# Patient Record
Sex: Female | Born: 1985 | Race: White | Hispanic: No | Marital: Married | Smoking: Never smoker
Health system: Southern US, Community
[De-identification: ages and names within clinical notes are randomized; demographics above are authoritative.]

## PROBLEM LIST (undated history)

## (undated) DIAGNOSIS — Z789 Other specified health status: Secondary | ICD-10-CM

## (undated) DIAGNOSIS — J45909 Unspecified asthma, uncomplicated: Secondary | ICD-10-CM

## (undated) HISTORY — PX: TONSILLECTOMY: SUR1361

## (undated) HISTORY — DX: Unspecified asthma, uncomplicated: J45.909

---

## 2006-06-18 ENCOUNTER — Other Ambulatory Visit: Admission: RE | Admit: 2006-06-18 | Discharge: 2006-06-18 | Payer: Self-pay | Admitting: Family Medicine

## 2007-06-20 ENCOUNTER — Other Ambulatory Visit: Admission: RE | Admit: 2007-06-20 | Discharge: 2007-06-20 | Payer: Self-pay | Admitting: Family Medicine

## 2007-12-02 ENCOUNTER — Emergency Department (HOSPITAL_COMMUNITY): Admission: EM | Admit: 2007-12-02 | Discharge: 2007-12-02 | Payer: Self-pay | Admitting: Emergency Medicine

## 2008-06-21 ENCOUNTER — Other Ambulatory Visit: Admission: RE | Admit: 2008-06-21 | Discharge: 2008-06-21 | Payer: Self-pay | Admitting: Family Medicine

## 2009-06-01 DIAGNOSIS — N949 Unspecified condition associated with female genital organs and menstrual cycle: Secondary | ICD-10-CM

## 2009-06-01 DIAGNOSIS — N938 Other specified abnormal uterine and vaginal bleeding: Secondary | ICD-10-CM | POA: Insufficient documentation

## 2009-06-01 DIAGNOSIS — R0602 Shortness of breath: Secondary | ICD-10-CM | POA: Insufficient documentation

## 2009-06-02 ENCOUNTER — Ambulatory Visit: Payer: Self-pay | Admitting: Pulmonary Disease

## 2009-06-02 ENCOUNTER — Encounter: Payer: Self-pay | Admitting: Pulmonary Disease

## 2009-07-01 ENCOUNTER — Other Ambulatory Visit: Admission: RE | Admit: 2009-07-01 | Discharge: 2009-07-01 | Payer: Self-pay | Admitting: Family Medicine

## 2010-06-12 ENCOUNTER — Other Ambulatory Visit: Admission: RE | Admit: 2010-06-12 | Discharge: 2010-06-12 | Payer: Self-pay | Admitting: Family Medicine

## 2010-11-21 NOTE — Letter (Signed)
Summary: Letter Concerning [facility]  Letter Concerning [facility]   Imported By: Lennie Odor 11/02/2009 12:27:15  _____________________________________________________________________  External Attachment:    Type:   Image     Comment:   External Document

## 2011-06-18 ENCOUNTER — Other Ambulatory Visit (HOSPITAL_COMMUNITY)
Admission: RE | Admit: 2011-06-18 | Discharge: 2011-06-18 | Disposition: A | Discharge status: Home / Self Care | Payer: Managed Care, Other (non HMO) | Source: Ambulatory Visit | Attending: Family Medicine | Admitting: Family Medicine

## 2011-06-18 DIAGNOSIS — Z01419 Encounter for gynecological examination (general) (routine) without abnormal findings: Secondary | ICD-10-CM | POA: Insufficient documentation

## 2011-07-13 LAB — COMPREHENSIVE METABOLIC PANEL
AST: 31
Albumin: 4.2
Chloride: 108
Creatinine, Ser: 0.84
GFR calc Af Amer: >60
Potassium: 4.8
Total Bilirubin: 1.6 — ABNORMAL HIGH
Total Protein: 7

## 2011-07-13 LAB — DIFFERENTIAL
Basophils Absolute: 0
Basophils Relative: 1
Eosinophils Absolute: 0
Eosinophils Relative: 0
Lymphocytes Relative: 20
Lymphs Abs: 1.3
Monocytes Absolute: 0.3
Monocytes Relative: 5
Neutro Abs: 4.7
Neutrophils Relative %: 74

## 2011-07-13 LAB — ACETAMINOPHEN LEVEL: Acetaminophen (Tylenol), Serum: <10 — ABNORMAL LOW

## 2011-07-13 LAB — ETHANOL: Alcohol, Ethyl (B): <5

## 2011-07-13 LAB — CBC
HCT: 41.2
Hemoglobin: 14.3
MCHC: 34.8
MCV: 87.7
Platelets: 224
RBC: 4.7
RDW: 12.5
WBC: 6.3

## 2011-07-13 LAB — COMPREHENSIVE METABOLIC PANEL WITH GFR
ALT: 28
Alkaline Phosphatase: 48
BUN: 5 — ABNORMAL LOW
CO2: 23
Calcium: 9.5
GFR calc non Af Amer: >60
Glucose, Bld: 80
Sodium: 140

## 2011-07-13 LAB — URINE MICROSCOPIC-ADD ON

## 2011-07-13 LAB — TRICYCLICS SCREEN, URINE: TCA Scrn: NOT DETECTED

## 2011-07-13 LAB — RAPID URINE DRUG SCREEN, HOSP PERFORMED: Tetrahydrocannabinol: NOT DETECTED

## 2011-07-13 LAB — URINALYSIS, ROUTINE W REFLEX MICROSCOPIC
Bilirubin Urine: NEGATIVE
Ketones, ur: 40 — AB
Specific Gravity, Urine: 1.018
pH: 8

## 2011-07-13 LAB — PROTIME-INR
INR: 1.1
Prothrombin Time: 14.2

## 2011-07-13 LAB — PREGNANCY, URINE: Preg Test, Ur: NEGATIVE

## 2011-07-13 LAB — URINE CULTURE
Colony Count: NO GROWTH
Culture: NO GROWTH

## 2011-07-13 LAB — SALICYLATE LEVEL: Salicylate Lvl: <4

## 2014-04-05 ENCOUNTER — Emergency Department (HOSPITAL_COMMUNITY): Payer: Managed Care, Other (non HMO)

## 2014-04-05 ENCOUNTER — Emergency Department (HOSPITAL_COMMUNITY)
Admission: EM | Admit: 2014-04-05 | Discharge: 2014-04-05 | Disposition: A | Discharge status: Home / Self Care | Payer: Managed Care, Other (non HMO) | Attending: Emergency Medicine | Admitting: Emergency Medicine

## 2014-04-05 ENCOUNTER — Encounter (HOSPITAL_COMMUNITY): Payer: Self-pay | Admitting: Emergency Medicine

## 2014-04-05 DIAGNOSIS — Z3202 Encounter for pregnancy test, result negative: Secondary | ICD-10-CM | POA: Insufficient documentation

## 2014-04-05 DIAGNOSIS — R1011 Right upper quadrant pain: Secondary | ICD-10-CM | POA: Insufficient documentation

## 2014-04-05 DIAGNOSIS — Z880 Allergy status to penicillin: Secondary | ICD-10-CM | POA: Insufficient documentation

## 2014-04-05 DIAGNOSIS — N201 Calculus of ureter: Secondary | ICD-10-CM | POA: Insufficient documentation

## 2014-04-05 LAB — CBC WITH DIFFERENTIAL/PLATELET
BASOS ABS: 0 10*3/uL (ref 0.0–0.1)
BASOS PCT: 0 % (ref 0–1)
EOS PCT: 2 % (ref 0–5)
Eosinophils Absolute: 0.2 10*3/uL (ref 0.0–0.7)
HEMATOCRIT: 41.3 % (ref 36.0–46.0)
Hemoglobin: 13.5 g/dL (ref 12.0–15.0)
Lymphocytes Relative: 42 % (ref 12–46)
Lymphs Abs: 3.3 10*3/uL (ref 0.7–4.0)
MCH: 29.4 pg (ref 26.0–34.0)
MCHC: 32.7 g/dL (ref 30.0–36.0)
MCV: 90 fL (ref 78.0–100.0)
Monocytes Absolute: 0.6 10*3/uL (ref 0.1–1.0)
Monocytes Relative: 7 % (ref 3–12)
NEUTROS ABS: 3.8 10*3/uL (ref 1.7–7.7)
Neutrophils Relative %: 49 % (ref 43–77)
Platelets: 282 10*3/uL (ref 150–400)
RBC: 4.59 MIL/uL (ref 3.87–5.11)
RDW: 12.4 % (ref 11.5–15.5)
WBC: 7.9 10*3/uL (ref 4.0–10.5)

## 2014-04-05 LAB — URINALYSIS, ROUTINE W REFLEX MICROSCOPIC
Bilirubin Urine: NEGATIVE
GLUCOSE, UA: NEGATIVE mg/dL
Hgb urine dipstick: NEGATIVE
KETONES UR: NEGATIVE mg/dL
LEUKOCYTES UA: NEGATIVE
Nitrite: NEGATIVE
Protein, ur: NEGATIVE mg/dL
Specific Gravity, Urine: 1.019 (ref 1.005–1.030)
Urobilinogen, UA: 1 mg/dL (ref 0.0–1.0)
pH: 8.5 — ABNORMAL HIGH (ref 5.0–8.0)

## 2014-04-05 LAB — COMPREHENSIVE METABOLIC PANEL
ALK PHOS: 95 U/L (ref 39–117)
ALT: 18 U/L (ref 0–35)
AST: 16 U/L (ref 0–37)
Albumin: 4.2 g/dL (ref 3.5–5.2)
BUN: 9 mg/dL (ref 6–23)
CALCIUM: 9.8 mg/dL (ref 8.4–10.5)
CO2: 23 meq/L (ref 19–32)
Chloride: 103 mEq/L (ref 96–112)
Creatinine, Ser: 0.75 mg/dL (ref 0.50–1.10)
GFR calc non Af Amer: >90 mL/min (ref >90)
GLUCOSE: 95 mg/dL (ref 70–99)
POTASSIUM: 4.6 meq/L (ref 3.7–5.3)
SODIUM: 141 meq/L (ref 137–147)
TOTAL PROTEIN: 7.7 g/dL (ref 6.0–8.3)
Total Bilirubin: 0.3 mg/dL (ref 0.3–1.2)

## 2014-04-05 LAB — POC URINE PREG, ED: Preg Test, Ur: NEGATIVE

## 2014-04-05 LAB — LIPASE, BLOOD: Lipase: 45 U/L (ref 11–59)

## 2014-04-05 MED ORDER — ONDANSETRON HCL 4 MG/2ML IJ SOLN
4.0000 mg | Freq: Once | INTRAMUSCULAR | Status: AC
  Administered 2014-04-05: 4 mg via INTRAVENOUS
  Filled 2014-04-05: qty 2

## 2014-04-05 MED ORDER — OXYCODONE-ACETAMINOPHEN 5-325 MG PO TABS
1.0000 | ORAL_TABLET | Freq: Once | ORAL | Status: AC
  Administered 2014-04-05: 1 via ORAL
  Filled 2014-04-05: qty 1

## 2014-04-05 MED ORDER — ONDANSETRON 4 MG PO TBDP
8.0000 mg | ORAL_TABLET | Freq: Once | ORAL | Status: AC
  Administered 2014-04-05: 8 mg via ORAL
  Filled 2014-04-05: qty 2

## 2014-04-05 MED ORDER — TAMSULOSIN HCL 0.4 MG PO CAPS: 0.4000 mg | ORAL_CAPSULE | Freq: Every day | ORAL | Status: DC

## 2014-04-05 MED ORDER — KETOROLAC TROMETHAMINE 30 MG/ML IJ SOLN
30.0000 mg | Freq: Once | INTRAMUSCULAR | Status: AC
  Administered 2014-04-05: 30 mg via INTRAVENOUS
  Filled 2014-04-05: qty 1

## 2014-04-05 MED ORDER — ONDANSETRON HCL 4 MG PO TABS: 4.0000 mg | ORAL_TABLET | Freq: Three times a day (TID) | ORAL | Status: DC | PRN

## 2014-04-05 MED ORDER — OXYCODONE-ACETAMINOPHEN 5-325 MG PO TABS: 1.0000 | ORAL_TABLET | ORAL | Status: DC | PRN

## 2014-04-05 MED ORDER — HYDROMORPHONE HCL PF 1 MG/ML IJ SOLN
1.0000 mg | INTRAMUSCULAR | Status: AC
  Administered 2014-04-05: 1 mg via INTRAVENOUS
  Filled 2014-04-05: qty 1

## 2014-04-05 NOTE — ED Notes (Signed)
Pt states that she ate some pimiento cheese at approx 1130. States that after that she began having cramps with nausea and vomiting. States that she has rt flank pain and "swelling".

## 2014-04-05 NOTE — ED Provider Notes (Signed)
CSN: [id number]     Arrival date & time 04/05/14  1602 History   First MD Initiated Contact with Patient 04/05/14 1818     Chief Complaint  Patient presents with  . Nausea  . Emesis  . Flank Pain     (Consider location/radiation/quality/duration/timing/severity/associated sxs/prior Treatment) HPI  Pt p/w acute onset right flank and right abdominal pain with associated N/V that began several hours ago and has gradually worsened.  Pain is sharp and crampy, constant.  Exacerbated by lying on her back. She initially thought she had food poisoning.  Denies diarrhea or change in bowels.  Denies urinary symptoms, abnormal vaginal discharge or bleeding.  LMP was two weeks ago, was on time and normal.  Denies possibility of pregnancy.  Denies hx kidney stones.  Denies hx of ovarian cysts.   Both parents have hx kidney stones.    History reviewed. No pertinent past medical history. Past Surgical History  Procedure Laterality Date  . Tonsillectomy     No family history on file. History  Substance Use Topics  . Smoking status: Never Smoker   . Smokeless tobacco: Not on file  . Alcohol Use: Not on file   OB History   Grav Para Term Preterm Abortions TAB SAB Ect Mult Living                 Review of Systems  All other systems reviewed and are negative.     Allergies  Penicillins  Home Medications   Prior to Admission medications   Medication Sig Start Date End Date Taking? Authorizing Provider  PRESCRIPTION MEDICATION Birth control 28 day   Yes Historical Provider, MD   BP 143/70  Pulse 75  Temp(Src) 98.1 F (36.7 C)  Resp 18  SpO2 100% Physical Exam  Nursing note and vitals reviewed. Constitutional: She appears well-developed and well-nourished. No distress.  HENT:  Head: Normocephalic and atraumatic.  Neck: Neck supple.  Cardiovascular: Normal rate and regular rhythm.   Pulmonary/Chest: Effort normal and breath sounds normal. No respiratory distress. She has no  wheezes. She has no rales.  Abdominal: Soft. She exhibits no distension. There is tenderness in the right upper quadrant and right lower quadrant. There is CVA tenderness (right). There is no rebound and no guarding.  Neurological: She is alert.  Skin: She is not diaphoretic.    ED Course  Procedures (including critical care time) Labs Review Labs Reviewed  URINALYSIS, ROUTINE W REFLEX MICROSCOPIC - Abnormal; Notable for the following:    APPearance CLOUDY (*)    pH 8.5 (*)    All other components within normal limits  URINE CULTURE  COMPREHENSIVE METABOLIC PANEL  CBC WITH DIFFERENTIAL  LIPASE, BLOOD  POC URINE PREG, ED    Imaging Review Ct Abdomen Pelvis Wo Contrast  04/05/2014   CLINICAL DATA:  Right flank pain  EXAM: CT ABDOMEN AND PELVIS WITHOUT CONTRAST  TECHNIQUE: Multidetector CT imaging of the abdomen and pelvis was performed following the standard protocol without IV contrast.  COMPARISON:  None.  FINDINGS: The lung bases are clear.  There is a 1 mm right UVJ calculus resulting in mild right hydroureteronephrosis. There is no other renal, ureteral or bladder calculus. No perinephric stranding is seen. The kidneys are symmetric in size without evidence for exophytic mass. The bladder is unremarkable.  The liver demonstrates no focal abnormality. The gallbladder is unremarkable. The spleen demonstrates no focal abnormality. The adrenal glands and pancreas are normal.  The unopacified stomach, duodenum, small  intestine and large intestine are unremarkable, but evaluation is limited by lack of oral contrast. There is no pneumoperitoneum, pneumatosis, or portal venous gas. There is no abdominal or pelvic free fluid. There is no lymphadenopathy.  The abdominal aorta is normal in caliber.  The osseous structures are unremarkable.  IMPRESSION: 1. There is a 1 mm right UVJ calculus resulting in mild right hydroureteronephrosis.   Electronically Signed   By: Elige KoHetal  Patel   On: 04/05/2014 19:07      EKG Interpretation None      6:28 PM Pt has arrived to the room, IV being established.  I have seen and examined the patient.  DDX includes ureteral stone, ovarian torsion.  Labs, UA unremarkable.  Pregnancy test is negative.  CT abd/pelvis without contrast, pelvic cart, pain and nausea medication ordered.   Father and mother have seen Dr Isabel CapriceGrapey in the past, would like to follow up with him.   MDM   Final diagnoses:  Right ureteral stone    Pt with acute onset right flank pain with N/V that began a few hours prior to arrival.  +CVA tenderness.  Found to have 1 mm right ureteral stone at UVJ with mild hydroureteronephrosis.  Labs otherwise unremarkable.  UA does not show infection.  Urine sent for culture.  Pain controlled in ED.  D/C home with percocet, zofran, flomax, urology follow up.  Discussed result, findings, treatment, and follow up  with patient.  Pt given return precautions.  Pt verbalizes understanding and agrees with plan.        Trixie Dredgemily Lizanne Erker, PA-C 04/05/14 1941

## 2014-04-05 NOTE — ED Provider Notes (Signed)
Medical screening examination/treatment/procedure(s) were performed by non-physician practitioner and as supervising physician I was immediately available for consultation/collaboration.    Nelia Shiobert L Mustafa Potts, MD 04/05/14 [id number]

## 2014-04-05 NOTE — Discharge Instructions (Signed)
Read the information below.  Use the prescribed medication as directed.  Please discuss all new medications with your pharmacist.  Do not take additional tylenol while taking the prescribed pain medication to avoid overdose.  You may return to the Emergency Department at any time for worsening condition or any new symptoms that concern you.  If you develop high fevers, worsening abdominal/flank pain, uncontrolled vomiting, or are unable to tolerate fluids by mouth or unable to urinate, return to the ER for a recheck.     Kidney Stones Kidney stones (urolithiasis) are deposits that form inside your kidneys. The intense pain is caused by the stone moving through the urinary tract. When the stone moves, the ureter goes into spasm around the stone. The stone is usually passed in the urine.  CAUSES   A disorder that makes certain neck glands produce too much parathyroid hormone (primary hyperparathyroidism).  A buildup of uric acid crystals, similar to gout in your joints.  Narrowing (stricture) of the ureter.  A kidney obstruction present at birth (congenital obstruction).  Previous surgery on the kidney or ureters.  Numerous kidney infections. SYMPTOMS   Feeling sick to your stomach (nauseous).  Throwing up (vomiting).  Blood in the urine (hematuria).  Pain that usually spreads (radiates) to the groin.  Frequency or urgency of urination. DIAGNOSIS   Taking a history and physical exam.  Blood or urine tests.  CT scan.  Occasionally, an examination of the inside of the urinary bladder (cystoscopy) is performed. TREATMENT   Observation.  Increasing your fluid intake.  Extracorporeal shock wave lithotripsy This is a noninvasive procedure that uses shock waves to break up kidney stones.  Surgery may be needed if you have severe pain or persistent obstruction. There are various surgical procedures. Most of the procedures are performed with the use of small instruments. Only small  incisions are needed to accommodate these instruments, so recovery time is minimized. The size, location, and chemical composition are all important variables that will determine the proper choice of action for you. Talk to your health care provider to better understand your situation so that you will minimize the risk of injury to yourself and your kidney.  HOME CARE INSTRUCTIONS   Drink enough water and fluids to keep your urine clear or pale yellow. This will help you to pass the stone or stone fragments.  Strain all urine through the provided strainer. Keep all particulate matter and stones for your health care provider to see. The stone causing the pain may be as small as a grain of salt. It is very important to use the strainer each and every time you pass your urine. The collection of your stone will allow your health care provider to analyze it and verify that a stone has actually passed. The stone analysis will often identify what you can do to reduce the incidence of recurrences.  Only take over-the-counter or prescription medicines for pain, discomfort, or fever as directed by your health care provider.  Make a follow-up appointment with your health care provider as directed.  Get follow-up X-rays if required. The absence of pain does not always mean that the stone has passed. It may have only stopped moving. If the urine remains completely obstructed, it can cause loss of kidney function or even complete destruction of the kidney. It is your responsibility to make sure X-rays and follow-ups are completed. Ultrasounds of the kidney can show blockages and the status of the kidney. Ultrasounds are not associated  with any radiation and can be performed easily in a matter of minutes. SEEK MEDICAL CARE IF:  You experience pain that is progressive and unresponsive to any pain medicine you have been prescribed. SEEK IMMEDIATE MEDICAL CARE IF:   Pain cannot be controlled with the prescribed  medicine.  You have a fever or shaking chills.  The severity or intensity of pain increases over 18 hours and is not relieved by pain medicine.  You develop a new onset of abdominal pain.  You feel faint or pass out.  You are unable to urinate. MAKE SURE YOU:   Understand these instructions.  Will watch your condition.  Will get help right away if you are not doing well or get worse. Document Released: 10/08/2005 Document Revised: 06/10/2013 Document Reviewed: 03/11/2013 [facility] Patient Information 2014 [place], [place].  Ureteral Colic (Kidney Stones) Ureteral colic is the result of a condition when kidney stones form inside the kidney. Once kidney stones are formed they may move into the tube that connects the kidney with the bladder (ureter). If this occurs, this condition may cause pain (colic) in the ureter.  CAUSES  Pain is caused by stone movement in the ureter and the obstruction caused by the stone. SYMPTOMS  The pain comes and goes as the ureter contracts around the stone. The pain is usually intense, sharp, and stabbing in character. The location of the pain may move as the stone moves through the ureter. When the stone is near the kidney the pain is usually located in the back and radiates to the belly (abdomen). When the stone is ready to pass into the bladder the pain is often located in the lower abdomen on the side the stone is located. At this location, the symptoms may mimic those of a urinary tract infection with urinary frequency. Once the stone is located here it often passes into the bladder and the pain disappears completely. TREATMENT   Your caregiver will provide you with medicine for pain relief.  You may require specialized follow-up X-rays.  The absence of pain does not always mean that the stone has passed. It may have just stopped moving. If the urine remains completely obstructed, it can cause loss of kidney function or even complete destruction of the  involved kidney. It is your responsibility and in your interest that X-rays and follow-ups as suggested by your caregiver are completed. Relief of pain without passage of the stone can be associated with severe damage to the kidney, including loss of kidney function on that side.  If your stone does not pass on its own, additional measures may be taken by your caregiver to ensure its removal. HOME CARE INSTRUCTIONS   Increase your fluid intake. Water is the preferred fluid since juices containing vitamin C may acidify the urine making it less likely for certain stones (uric acid stones) to pass.  Strain all urine. A strainer will be provided. Keep all particulate matter or stones for your caregiver to inspect.  Take your pain medicine as directed.  Make a follow-up appointment with your caregiver as directed.  Remember that the goal is passage of your stone. The absence of pain does not mean the stone is gone. Follow your caregiver's instructions.  Only take over-the-counter or prescription medicines for pain, discomfort, or fever as directed by your caregiver. SEEK MEDICAL CARE IF:   Pain cannot be controlled with the prescribed medicine.  You have a fever.  Pain continues for longer than your caregiver advises it  should.  There is a change in the pain, and you develop chest discomfort or constant abdominal pain.  You feel faint or pass out. MAKE SURE YOU:   Understand these instructions.  Will watch your condition.  Will get help right away if you are not doing well or get worse. Document Released: 07/18/2005 Document Revised: 02/02/2013 Document Reviewed: 04/04/2011 [facility] Patient Information 2014 [place], [place].  Diet for Kidney Stones Kidney stones are small, hard masses that form inside your kidneys. They are made up of salts and minerals and often form when high levels build up in the urine. The minerals can then start to build up, crystalize, and stick together to  form stones. There are several different types of kidney stones. The following types of stones may be influenced by dietary factors:   Calcium Oxalate Stones. An oxalate is a salt found in certain foods. Within the body, calcium can combine with oxalates to form calcium oxalate stones, which can be excreted in the urine in high amounts. This is the most common type of kidney stone.  Calcium Phosphate Stones. These stones may occur when the pH of the urine becomes too high, or less acidic, from too much calcium being excreted in the urine. The pH is a measure of how acidic or basic a substance is.  Uric Acid Stones. This type of stone occurs when the pH of the urine becomes too low, or very acidic, because substances called purines build up in the urine. Purines are found in animal proteins. When the urine is highly concentrated with acid, uric acid kidney stones can form.  Other risk factors for kidney stones include genetics, environment, and being overweight. Your caregiver may ask you to follow specific diet guidelines based on the type of stone you have to lessen the chances of your body making more kidney stones.  GENERAL GUIDELINES FOR ALL TYPES OF STONES  Drink plenty of fluid. Drink 12 16 cups of fluid a day, drinking mainly water.This is the most important thing you can do to prevent the formation of future kidney stones.  Maintain a healthy weight. Your caregiver or dietitian can help you determine what a healthy weight is for you. If you are overweight, weight loss may help prevent the formation of future kidney stones.  Eat a diet adequate in animal protein. Too much animal protein can contribute to the formation of stones. Your dietitian can help you determine how much protein you should be eating. Avoid low carbohydrate, high protein diets.  Follow a balanced eating approach. The DASH diet, which stands for "Dietary Approaches to Stop Hypertension," is an effective meal plan for  reducing stone formation. This diet is high in fruits, vegetables, dairy, and whole grains and low in animal protein. Ask your caregiver or dietitian for information about the DASH diet. ADDITIONAL DIET GUIDELINES FOR CALCIUM STONES Avoid foods high in salt. This includes table salt, salt seasonings, MSG, soy sauce, cured and processed meats, salted crackers and snack foods, fast food, and canned soups and foods. Ask your caregiver or dietitian for information about reducing sodium in your diet or following the low sodium diet.  Ensure adequate calcium intake. Use the following table for calcium guidelines:  Men 28 years old and younger  1000 mg/day.  Men 260 years old and older  1500 mg/day.  Women 8225 28 years old  1000 mg/day.  Women 50 years and older  1500 mg/day. Your dietitian can help you determine if you are getting  enough calcium in your diet. Foods that are high in calcium include dairy products, broccoli, cheese, yogurt, and pudding. If you need to take a calcium supplement, take it only in the form of calcium citrate.  Avoid foods high in oxalate. Be sure that any supplements you take do not contain more than 500 mg of vitamin C. Vitamin C is converted into oxalate in the body. You do not need to avoid fruits and vegetables high in vitamin C.   Grains: High-fiber or bran cereal, whole-wheat bread, grits, barley, buckwheat, amaranth, pretzels, and fruitcake.  Vegetables: Dried beans, wax beans, dark leafy greens, eggplant, leeks, okra, parsley, rutabaga, tomato paste, watercress, zucchini, and escarole.  Fruit: Dried apricots, red currants, figs, kiwi, and rhubarb.  Meat and Meat Substitutes: Soybeans and foods made from soy (soyburger, miso), dried beans, peanut butter.  Milk: Chocolate milk mixes and soymilk.  Fats and Oils: Nuts (peanuts, almonds, pecans, cashews, hazelnuts) and nut butters, sesame seeds, and tDahini paste.  Condiments/Miscellaneous: Chocolate, carob,  marmalade, poppy seeds, instant iced tea, and juice from high-oxalate fruits.  Document Released: 02/02/2011 Document Revised: 04/08/2012 Document Reviewed: 03/24/2012 [facility] Patient Information 2014 [place], [place].

## 2014-04-05 NOTE — ED Notes (Signed)
Pt returned from CT °

## 2014-04-07 LAB — URINE CULTURE
CULTURE: NO GROWTH
Colony Count: NO GROWTH

## 2014-08-09 ENCOUNTER — Other Ambulatory Visit: Payer: Self-pay | Admitting: Family Medicine

## 2014-08-09 ENCOUNTER — Other Ambulatory Visit (HOSPITAL_COMMUNITY)
Admission: RE | Admit: 2014-08-09 | Discharge: 2014-08-09 | Disposition: A | Discharge status: Home / Self Care | Payer: Managed Care, Other (non HMO) | Source: Ambulatory Visit | Attending: Family Medicine | Admitting: Family Medicine

## 2014-08-09 DIAGNOSIS — Z124 Encounter for screening for malignant neoplasm of cervix: Secondary | ICD-10-CM | POA: Insufficient documentation

## 2014-08-09 DIAGNOSIS — Z1151 Encounter for screening for human papillomavirus (HPV): Secondary | ICD-10-CM | POA: Diagnosis present

## 2014-08-11 LAB — CYTOLOGY - PAP

## 2015-03-14 LAB — OB RESULTS CONSOLE GC/CHLAMYDIA
CHLAMYDIA, DNA PROBE: NEGATIVE
GC PROBE AMP, GENITAL: NEGATIVE

## 2015-03-14 LAB — OB RESULTS CONSOLE ANTIBODY SCREEN: ANTIBODY SCREEN: NEGATIVE

## 2015-03-14 LAB — OB RESULTS CONSOLE ABO/RH: RH Type: POSITIVE

## 2015-03-14 LAB — OB RESULTS CONSOLE HEPATITIS B SURFACE ANTIGEN: HEP B S AG: NEGATIVE

## 2015-03-14 LAB — OB RESULTS CONSOLE HIV ANTIBODY (ROUTINE TESTING): HIV: NONREACTIVE

## 2015-03-14 LAB — OB RESULTS CONSOLE RPR: RPR: NONREACTIVE

## 2015-03-14 LAB — OB RESULTS CONSOLE RUBELLA ANTIBODY, IGM: Rubella: IMMUNE

## 2015-10-06 ENCOUNTER — Encounter (HOSPITAL_COMMUNITY): Payer: Self-pay | Admitting: *Deleted

## 2015-10-06 ENCOUNTER — Inpatient Hospital Stay (HOSPITAL_COMMUNITY)
Admission: AD | Admit: 2015-10-06 | Discharge: 2015-10-06 | Disposition: A | Discharge status: Home / Self Care | Payer: 59 | Source: Ambulatory Visit | Attending: Obstetrics & Gynecology | Admitting: Obstetrics & Gynecology

## 2015-10-06 DIAGNOSIS — Z3493 Encounter for supervision of normal pregnancy, unspecified, third trimester: Secondary | ICD-10-CM | POA: Diagnosis not present

## 2015-10-06 HISTORY — DX: Other specified health status: Z78.9

## 2015-10-06 MED ORDER — OXYCODONE-ACETAMINOPHEN 5-325 MG PO TABS: 1.0000 | ORAL_TABLET | Freq: Four times a day (QID) | ORAL | Status: DC | PRN

## 2015-10-06 NOTE — MAU Note (Signed)
last night was having some pain. This morning the pain was worse.  Went to office, was contracting 3-5 min.  Contractions have continued and gotten stronger.  (was FT per Dr Charlotta Newtonzan in office)

## 2015-10-06 NOTE — Discharge Instructions (Signed)

## 2015-10-14 ENCOUNTER — Inpatient Hospital Stay (HOSPITAL_COMMUNITY): Payer: 59 | Admitting: Anesthesiology

## 2015-10-14 ENCOUNTER — Inpatient Hospital Stay (HOSPITAL_COMMUNITY)
Admission: AD | Admit: 2015-10-14 | Discharge: 2015-10-17 | DRG: 766 | Disposition: A | Discharge status: Home / Self Care | Payer: 59 | Source: Ambulatory Visit | Attending: Obstetrics & Gynecology | Admitting: Obstetrics & Gynecology

## 2015-10-14 ENCOUNTER — Encounter (HOSPITAL_COMMUNITY): Payer: Self-pay | Admitting: *Deleted

## 2015-10-14 ENCOUNTER — Encounter (HOSPITAL_COMMUNITY)
Admission: AD | Disposition: A | Discharge status: Home / Self Care | Payer: Self-pay | Source: Ambulatory Visit | Attending: Obstetrics & Gynecology

## 2015-10-14 DIAGNOSIS — Z6839 Body mass index (BMI) 39.0-39.9, adult: Secondary | ICD-10-CM

## 2015-10-14 DIAGNOSIS — O99214 Obesity complicating childbirth: Secondary | ICD-10-CM | POA: Diagnosis present

## 2015-10-14 DIAGNOSIS — E669 Obesity, unspecified: Secondary | ICD-10-CM | POA: Diagnosis present

## 2015-10-14 DIAGNOSIS — B360 Pityriasis versicolor: Secondary | ICD-10-CM | POA: Diagnosis present

## 2015-10-14 DIAGNOSIS — N856 Intrauterine synechiae: Secondary | ICD-10-CM | POA: Diagnosis present

## 2015-10-14 DIAGNOSIS — O134 Gestational [pregnancy-induced] hypertension without significant proteinuria, complicating childbirth: Secondary | ICD-10-CM | POA: Diagnosis present

## 2015-10-14 DIAGNOSIS — Z3490 Encounter for supervision of normal pregnancy, unspecified, unspecified trimester: Secondary | ICD-10-CM

## 2015-10-14 DIAGNOSIS — Z3A38 38 weeks gestation of pregnancy: Secondary | ICD-10-CM

## 2015-10-14 DIAGNOSIS — Z98891 History of uterine scar from previous surgery: Secondary | ICD-10-CM

## 2015-10-14 LAB — URIC ACID: Uric Acid, Serum: 7.3 mg/dL — ABNORMAL HIGH (ref 2.3–6.6)

## 2015-10-14 LAB — PROTEIN / CREATININE RATIO, URINE
Creatinine, Urine: 127 mg/dL
Protein Creatinine Ratio: 0.13 mg/mg{Cre} (ref 0.00–0.15)
Total Protein, Urine: 16 mg/dL

## 2015-10-14 LAB — COMPREHENSIVE METABOLIC PANEL
ALBUMIN: 2.9 g/dL — AB (ref 3.5–5.0)
ALK PHOS: 237 U/L — AB (ref 38–126)
ALT: 16 U/L (ref 14–54)
AST: 28 U/L (ref 15–41)
Anion gap: 12 (ref 5–15)
BILIRUBIN TOTAL: 0.6 mg/dL (ref 0.3–1.2)
BUN: 13 mg/dL (ref 6–20)
CALCIUM: 9.5 mg/dL (ref 8.9–10.3)
CO2: 21 mmol/L — ABNORMAL LOW (ref 22–32)
CREATININE: 0.66 mg/dL (ref 0.44–1.00)
Chloride: 102 mmol/L (ref 101–111)
GFR calc Af Amer: >60 mL/min (ref >60)
GFR calc non Af Amer: >60 mL/min (ref >60)
GLUCOSE: 92 mg/dL (ref 65–99)
Potassium: 4.4 mmol/L (ref 3.5–5.1)
Sodium: 135 mmol/L (ref 135–145)
TOTAL PROTEIN: 6.3 g/dL — AB (ref 6.5–8.1)

## 2015-10-14 LAB — CBC
HEMATOCRIT: 38.8 % (ref 36.0–46.0)
HEMOGLOBIN: 12.5 g/dL (ref 12.0–15.0)
MCH: 28.7 pg (ref 26.0–34.0)
MCHC: 32.2 g/dL (ref 30.0–36.0)
MCV: 89 fL (ref 78.0–100.0)
Platelets: 216 10*3/uL (ref 150–400)
RBC: 4.36 MIL/uL (ref 3.87–5.11)
RDW: 15.1 % (ref 11.5–15.5)
WBC: 7.5 10*3/uL (ref 4.0–10.5)

## 2015-10-14 LAB — OB RESULTS CONSOLE GBS: GBS: NEGATIVE

## 2015-10-14 LAB — TYPE AND SCREEN
ABO/RH(D): B POS
Antibody Screen: NEGATIVE

## 2015-10-14 LAB — ABO/RH: ABO/RH(D): B POS

## 2015-10-14 SURGERY — Surgical Case
Anesthesia: Epidural | Site: Abdomen

## 2015-10-14 MED ORDER — DEXAMETHASONE SODIUM PHOSPHATE 4 MG/ML IJ SOLN
INTRAMUSCULAR | Status: AC
  Filled 2015-10-14: qty 1

## 2015-10-14 MED ORDER — MORPHINE SULFATE (PF) 0.5 MG/ML IJ SOLN
INTRAMUSCULAR | Status: DC | PRN
  Administered 2015-10-14: 3 mg via EPIDURAL
  Administered 2015-10-14: 2 mg via INTRAVENOUS

## 2015-10-14 MED ORDER — OXYCODONE-ACETAMINOPHEN 5-325 MG PO TABS: 2.0000 | ORAL_TABLET | ORAL | Status: DC | PRN

## 2015-10-14 MED ORDER — OXYTOCIN 10 UNIT/ML IJ SOLN
40.0000 [IU] | INTRAMUSCULAR | Status: DC | PRN
  Administered 2015-10-14: 40 [IU] via INTRAVENOUS

## 2015-10-14 MED ORDER — SCOPOLAMINE 1 MG/3DAYS TD PT72
MEDICATED_PATCH | TRANSDERMAL | Status: DC | PRN
  Administered 2015-10-14: 1 via TRANSDERMAL

## 2015-10-14 MED ORDER — KETOROLAC TROMETHAMINE 30 MG/ML IJ SOLN: 30.0000 mg | Freq: Four times a day (QID) | INTRAMUSCULAR | Status: DC | PRN

## 2015-10-14 MED ORDER — OXYTOCIN 40 UNITS IN LACTATED RINGERS INFUSION - SIMPLE MED: 62.5000 mL/h | INTRAVENOUS | Status: DC

## 2015-10-14 MED ORDER — LACTATED RINGERS IV SOLN
INTRAVENOUS | Status: DC
  Administered 2015-10-14 (×3): via INTRAVENOUS

## 2015-10-14 MED ORDER — KETOROLAC TROMETHAMINE 30 MG/ML IJ SOLN: 30.0000 mg | Freq: Once | INTRAMUSCULAR | Status: AC | PRN

## 2015-10-14 MED ORDER — PROMETHAZINE HCL 25 MG/ML IJ SOLN: 6.2500 mg | INTRAMUSCULAR | Status: DC | PRN

## 2015-10-14 MED ORDER — SCOPOLAMINE 1 MG/3DAYS TD PT72
MEDICATED_PATCH | TRANSDERMAL | Status: AC
  Filled 2015-10-14: qty 1

## 2015-10-14 MED ORDER — SODIUM CHLORIDE 0.9 % IR SOLN
Status: DC | PRN
Start: 2015-10-14 — End: 2015-10-14
  Administered 2015-10-14: 1000 mL

## 2015-10-14 MED ORDER — MEPERIDINE HCL 25 MG/ML IJ SOLN: 6.2500 mg | INTRAMUSCULAR | Status: DC | PRN

## 2015-10-14 MED ORDER — ONDANSETRON HCL 4 MG/2ML IJ SOLN
4.0000 mg | Freq: Four times a day (QID) | INTRAMUSCULAR | Status: DC | PRN
  Administered 2015-10-14: 4 mg via INTRAVENOUS

## 2015-10-14 MED ORDER — SODIUM BICARBONATE 8.4 % IV SOLN
INTRAVENOUS | Status: DC | PRN
  Administered 2015-10-14 (×5): 5 mL via EPIDURAL

## 2015-10-14 MED ORDER — PHENYLEPHRINE 40 MCG/ML (10ML) SYRINGE FOR IV PUSH (FOR BLOOD PRESSURE SUPPORT)
80.0000 ug | PREFILLED_SYRINGE | INTRAVENOUS | Status: DC | PRN
  Filled 2015-10-14: qty 20

## 2015-10-14 MED ORDER — FENTANYL CITRATE (PF) 100 MCG/2ML IJ SOLN
INTRAMUSCULAR | Status: DC | PRN
  Administered 2015-10-14: 50 ug via INTRAVENOUS

## 2015-10-14 MED ORDER — OXYTOCIN 10 UNIT/ML IJ SOLN
INTRAMUSCULAR | Status: AC
  Filled 2015-10-14: qty 4

## 2015-10-14 MED ORDER — ONDANSETRON HCL 4 MG/2ML IJ SOLN
INTRAMUSCULAR | Status: AC
  Filled 2015-10-14: qty 4

## 2015-10-14 MED ORDER — CITRIC ACID-SODIUM CITRATE 334-500 MG/5ML PO SOLN
30.0000 mL | ORAL | Status: DC | PRN
  Administered 2015-10-14: 30 mL via ORAL
  Filled 2015-10-14: qty 15

## 2015-10-14 MED ORDER — FENTANYL CITRATE (PF) 100 MCG/2ML IJ SOLN
INTRAMUSCULAR | Status: AC
  Filled 2015-10-14: qty 2

## 2015-10-14 MED ORDER — OXYCODONE-ACETAMINOPHEN 5-325 MG PO TABS: 1.0000 | ORAL_TABLET | ORAL | Status: DC | PRN

## 2015-10-14 MED ORDER — SODIUM BICARBONATE 8.4 % IV SOLN
INTRAVENOUS | Status: AC
  Filled 2015-10-14: qty 50

## 2015-10-14 MED ORDER — LIDOCAINE HCL (PF) 1 % IJ SOLN
30.0000 mL | INTRAMUSCULAR | Status: DC | PRN
  Filled 2015-10-14: qty 30

## 2015-10-14 MED ORDER — LACTATED RINGERS IV SOLN
INTRAVENOUS | Status: DC
  Administered 2015-10-14: 20:00:00 via INTRAUTERINE

## 2015-10-14 MED ORDER — OXYTOCIN BOLUS FROM INFUSION: 500.0000 mL | INTRAVENOUS | Status: DC

## 2015-10-14 MED ORDER — DEXAMETHASONE SODIUM PHOSPHATE 4 MG/ML IJ SOLN
INTRAMUSCULAR | Status: DC | PRN
  Administered 2015-10-14: 4 mg via INTRAVENOUS

## 2015-10-14 MED ORDER — EPHEDRINE 5 MG/ML INJ: 10.0000 mg | INTRAVENOUS | Status: DC | PRN

## 2015-10-14 MED ORDER — GENTAMICIN SULFATE 40 MG/ML IJ SOLN
INTRAVENOUS | Status: AC
  Administered 2015-10-14: 117.25 mL via INTRAVENOUS
  Filled 2015-10-14: qty 11.25

## 2015-10-14 MED ORDER — FENTANYL 2.5 MCG/ML BUPIVACAINE 1/10 % EPIDURAL INFUSION (WH - ANES)
14.0000 mL/h | INTRAMUSCULAR | Status: DC | PRN
  Administered 2015-10-14: 14 mL/h via EPIDURAL
  Administered 2015-10-14: 12.5 mL/h via EPIDURAL
  Filled 2015-10-14 (×2): qty 125

## 2015-10-14 MED ORDER — MORPHINE SULFATE (PF) 0.5 MG/ML IJ SOLN
INTRAMUSCULAR | Status: AC
  Filled 2015-10-14: qty 10

## 2015-10-14 MED ORDER — LIDOCAINE HCL (PF) 1 % IJ SOLN
INTRAMUSCULAR | Status: DC | PRN
  Administered 2015-10-14 (×2): 4 mL

## 2015-10-14 MED ORDER — TERBUTALINE SULFATE 1 MG/ML IJ SOLN: 0.2500 mg | Freq: Once | INTRAMUSCULAR | Status: DC | PRN

## 2015-10-14 MED ORDER — OXYTOCIN 40 UNITS IN LACTATED RINGERS INFUSION - SIMPLE MED
1.0000 m[IU]/min | INTRAVENOUS | Status: DC
  Administered 2015-10-14: 1 m[IU]/min via INTRAVENOUS
  Filled 2015-10-14: qty 1000

## 2015-10-14 MED ORDER — DIPHENHYDRAMINE HCL 50 MG/ML IJ SOLN: 12.5000 mg | INTRAMUSCULAR | Status: DC | PRN

## 2015-10-14 MED ORDER — KETOROLAC TROMETHAMINE 30 MG/ML IJ SOLN
30.0000 mg | Freq: Four times a day (QID) | INTRAMUSCULAR | Status: DC | PRN
  Administered 2015-10-15: 30 mg via INTRAMUSCULAR

## 2015-10-14 MED ORDER — ONDANSETRON HCL 4 MG/2ML IJ SOLN
INTRAMUSCULAR | Status: AC
  Filled 2015-10-14: qty 2

## 2015-10-14 MED ORDER — HYDROMORPHONE HCL 1 MG/ML IJ SOLN: 0.2500 mg | INTRAMUSCULAR | Status: DC | PRN

## 2015-10-14 MED ORDER — ACETAMINOPHEN 325 MG PO TABS: 650.0000 mg | ORAL_TABLET | ORAL | Status: DC | PRN

## 2015-10-14 MED ORDER — LACTATED RINGERS IV SOLN: 500.0000 mL | INTRAVENOUS | Status: DC | PRN

## 2015-10-14 SURGICAL SUPPLY — 45 items
APL SKNCLS STERI-STRIP NONHPOA (GAUZE/BANDAGES/DRESSINGS) ×1
BARRIER ADHS 3X4 INTERCEED (GAUZE/BANDAGES/DRESSINGS) ×1 IMPLANT
BENZOIN TINCTURE PRP APPL 2/3 (GAUZE/BANDAGES/DRESSINGS) ×2 IMPLANT
BRR ADH 4X3 ABS CNTRL BYND (GAUZE/BANDAGES/DRESSINGS) ×1
CLAMP CORD UMBIL (MISCELLANEOUS) IMPLANT
CLOTH BEACON ORANGE TIMEOUT ST (SAFETY) ×2 IMPLANT
DRAPE SHEET LG 3/4 BI-LAMINATE (DRAPES) IMPLANT
DRSG OPSITE POSTOP 4X10 (GAUZE/BANDAGES/DRESSINGS) ×2 IMPLANT
DURAPREP 26ML APPLICATOR (WOUND CARE) ×2 IMPLANT
ELECT REM PT RETURN 9FT ADLT (ELECTROSURGICAL) ×2
ELECTRODE REM PT RTRN 9FT ADLT (ELECTROSURGICAL) ×1 IMPLANT
EXTRACTOR VACUUM KIWI (MISCELLANEOUS) IMPLANT
GLOVE BIOGEL PI IND STRL 6.5 (GLOVE) ×1 IMPLANT
GLOVE BIOGEL PI IND STRL 7.0 (GLOVE) ×1 IMPLANT
GLOVE BIOGEL PI INDICATOR 6.5 (GLOVE) ×1
GLOVE BIOGEL PI INDICATOR 7.0 (GLOVE) ×4
GLOVE ECLIPSE 6.5 STRL STRAW (GLOVE) ×2 IMPLANT
GLOVE SURG SS PI 7.0 STRL IVOR (GLOVE) ×2 IMPLANT
GOWN STRL REUS W/TWL LRG LVL3 (GOWN DISPOSABLE) ×5 IMPLANT
KIT ABG SYR 3ML LUER SLIP (SYRINGE) IMPLANT
LIQUID BAND (GAUZE/BANDAGES/DRESSINGS) IMPLANT
NDL HYPO 25X5/8 SAFETYGLIDE (NEEDLE) IMPLANT
NEEDLE HYPO 25X5/8 SAFETYGLIDE (NEEDLE) IMPLANT
NS IRRIG 1000ML POUR BTL (IV SOLUTION) ×2 IMPLANT
PACK C SECTION WH (CUSTOM PROCEDURE TRAY) ×2 IMPLANT
PAD ABD 7.5X8 STRL (GAUZE/BANDAGES/DRESSINGS) ×2 IMPLANT
PAD OB MATERNITY 4.3X12.25 (PERSONAL CARE ITEMS) ×2 IMPLANT
PENCIL SMOKE EVAC W/HOLSTER (ELECTROSURGICAL) ×2 IMPLANT
RTRCTR C-SECT PINK 25CM LRG (MISCELLANEOUS) ×2 IMPLANT
SPONGE GAUZE 4X4 12PLY STER LF (GAUZE/BANDAGES/DRESSINGS) ×1 IMPLANT
STRIP CLOSURE SKIN 1/2X4 (GAUZE/BANDAGES/DRESSINGS) ×2 IMPLANT
SUT PLAIN 0 NONE (SUTURE) IMPLANT
SUT PLAIN 2 0 (SUTURE) ×2
SUT PLAIN 2 0 XLH (SUTURE) IMPLANT
SUT PLAIN ABS 2-0 CT1 27XMFL (SUTURE) IMPLANT
SUT VIC AB 0 CT1 27 (SUTURE) ×4
SUT VIC AB 0 CT1 27XBRD ANBCTR (SUTURE) ×2 IMPLANT
SUT VIC AB 0 CT1 36 (SUTURE) ×2 IMPLANT
SUT VIC AB 0 CTX 36 (SUTURE) ×6
SUT VIC AB 0 CTX36XBRD ANBCTRL (SUTURE) ×3 IMPLANT
SUT VIC AB 2-0 CT1 27 (SUTURE) ×2
SUT VIC AB 2-0 CT1 TAPERPNT 27 (SUTURE) ×1 IMPLANT
SUT VIC AB 4-0 KS 27 (SUTURE) ×2 IMPLANT
TOWEL OR 17X24 6PK STRL BLUE (TOWEL DISPOSABLE) ×3 IMPLANT
TRAY FOLEY CATH SILVER 14FR (SET/KITS/TRAYS/PACK) IMPLANT

## 2015-10-14 NOTE — Anesthesia Preprocedure Evaluation (Addendum)
Anesthesia Evaluation  Patient identified by MRN, date of birth, ID band Patient awake    Reviewed: Allergy & Precautions, NPO status , Patient's Chart, lab work & pertinent test results  History of Anesthesia Complications Negative for: history of anesthetic complications  Airway Mallampati: II  TM Distance: >3 FB Neck ROM: Full    Dental no notable dental hx. (+) Dental Advisory Given   Pulmonary neg pulmonary ROS,    Pulmonary exam normal breath sounds clear to auscultation       Cardiovascular negative cardio ROS Normal cardiovascular exam Rhythm:Regular Rate:Normal     Neuro/Psych negative neurological ROS  negative psych ROS   GI/Hepatic negative GI ROS, Neg liver ROS,   Endo/Other  obesity  Renal/GU negative Renal ROS  negative genitourinary   Musculoskeletal negative musculoskeletal ROS (+)   Abdominal   Peds negative pediatric ROS (+)  Hematology negative hematology ROS (+)   Anesthesia Other Findings   Reproductive/Obstetrics (+) Pregnancy                             Anesthesia Physical Anesthesia Plan  ASA: II  Anesthesia Plan: Epidural   Post-op Pain Management:    Induction:   Airway Management Planned:   Additional Equipment:   Intra-op Plan:   Post-operative Plan:   Informed Consent: I have reviewed the patients History and Physical, chart, labs and discussed the procedure including the risks, benefits and alternatives for the proposed anesthesia with the patient or authorized representative who has indicated his/her understanding and acceptance.   Dental advisory given  Plan Discussed with: CRNA and Surgeon  Anesthesia Plan Comments: (For C/S under epidural)       Anesthesia Quick Evaluation

## 2015-10-14 NOTE — Transfer of Care (Signed)
Immediate Anesthesia Transfer of Care Note  Patient: Earley AbideSamantha Ellis  Procedure(s) Performed: Procedure(s): CESAREAN SECTION (N/A)  Patient Location: PACU  Anesthesia Type:Epidural  Level of Consciousness: awake, alert  and oriented  Airway & Oxygen Therapy: Patient Spontanous Breathing  Post-op Assessment: Report given to RN and Post -op Vital signs reviewed and stable  Post vital signs: Reviewed and stable  Last Vitals:  Filed Vitals:   10/14/15 2059 10/14/15 2100  BP:  123/69  Pulse:  85  Temp: 37.9 C   Resp:      Complications: No apparent anesthesia complications

## 2015-10-14 NOTE — Anesthesia Procedure Notes (Signed)
Epidural Patient location during procedure: OB  Staffing Anesthesiologist: Kandra Graven Performed by: anesthesiologist   Preanesthetic Checklist Completed: patient identified, site marked, surgical consent, pre-op evaluation, timeout performed, IV checked, risks and benefits discussed and monitors and equipment checked  Epidural Patient position: sitting Prep: site prepped and draped and DuraPrep Patient monitoring: continuous pulse ox and blood pressure Approach: midline Location: L3-L4 Injection technique: LOR saline  Needle:  Needle type: Tuohy  Needle gauge: 17 G Needle length: 9 cm and 9 Needle insertion depth: 7 cm Catheter type: closed end flexible Catheter size: 19 Gauge Catheter at skin depth: 12 cm Test dose: negative  Assessment Events: blood not aspirated, injection not painful, no injection resistance, negative IV test and no paresthesia  Additional Notes Patient identified. Risks/Benefits/Options discussed with patient including but not limited to bleeding, infection, nerve damage, paralysis, failed block, incomplete pain control, headache, blood pressure changes, nausea, vomiting, reactions to medication both or allergic, itching and postpartum back pain. Confirmed with bedside nurse the patient's most recent platelet count. Confirmed with patient that they are not currently taking any anticoagulation, have any bleeding history or any family history of bleeding disorders. Patient expressed understanding and wished to proceed. All questions were answered. Sterile technique was used throughout the entire procedure. Please see nursing notes for vital signs. Test dose was given through epidural catheter and negative prior to continuing to dose epidural or start infusion. Warning signs of high block given to the patient including shortness of breath, tingling/numbness in hands, complete motor block, or any concerning symptoms with instructions to call for help. Patient was  given instructions on fall risk and not to get out of bed. All questions and concerns addressed with instructions to call with any issues or inadequate analgesia.      

## 2015-10-14 NOTE — MAU Note (Signed)
Report given to [facility] Koran RN, pt to go to room 166.

## 2015-10-14 NOTE — Progress Notes (Signed)
OB PN:  S: Pt resting comfortably with epidural  O: BP 128/92 mmHg  Pulse 65  Temp(Src) 98.4 F (36.9 C) (Oral)  Resp 20  Ht 5' (1.524 m)  Wt 90.719 kg (200 lb)  BMI 39.06 kg/m2  SpO2 100%  BP range:106-171/58-90  FHT: 140bpm, moderate variablity, + accels, + variable decels to 90bpm for ~20sec with contractions Toco: q2-704min, MVU <200 SVE: C/C/0  Results for orders placed or performed during the hospital encounter of 10/14/15 (from the past 24 hour(s))  OB RESULT CONSOLE Group B Strep     Status: None   Collection Time: 10/14/15 12:00 AM  Result Value Ref Range   GBS Negative   CBC     Status: None   Collection Time: 10/14/15  9:55 AM  Result Value Ref Range   WBC 7.5 4.0 - 10.5 K/uL   RBC 4.36 3.87 - 5.11 MIL/uL   Hemoglobin 12.5 12.0 - 15.0 g/dL   HCT 81.138.8 91.436.0 - 78.246.0 %   MCV 89.0 78.0 - 100.0 fL   MCH 28.7 26.0 - 34.0 pg   MCHC 32.2 30.0 - 36.0 g/dL   RDW 95.615.1 21.311.5 - 08.615.5 %   Platelets 216 150 - 400 K/uL  [facility]      Status: None   Collection Time: 10/14/15  9:55 AM  Result Value Ref Range   ABO/RH(D) B POS    Antibody Screen NEG    Sample Expiration 10/17/2015   Comprehensive metabolic panel     Status: Abnormal   Collection Time: 10/14/15  9:55 AM  Result Value Ref Range   Sodium 135 135 - 145 mmol/L   Potassium 4.4 3.5 - 5.1 mmol/L   Chloride 102 101 - 111 mmol/L   CO2 21 (L) 22 - 32 mmol/L   Glucose, Bld 92 65 - 99 mg/dL   BUN 13 6 - 20 mg/dL   Creatinine, Ser 5.780.66 0.44 - 1.00 mg/dL   Calcium 9.5 8.9 - 46.910.3 mg/dL   Total Protein 6.3 (L) 6.5 - 8.1 g/dL   Albumin 2.9 (L) 3.5 - 5.0 g/dL   AST 28 15 - 41 U/L   ALT 16 14 - 54 U/L   Alkaline Phosphatase 237 (H) 38 - 126 U/L   Total Bilirubin 0.6 0.3 - 1.2 mg/dL   GFR calc non Af Amer >60 >60 mL/min   GFR calc Af Amer >60 >60 mL/min   Anion gap 12 5 - 15  Uric acid     Status: Abnormal   Collection Time: 10/14/15  9:55 AM  Result Value Ref Range   Uric Acid,  Serum 7.3 (H) 2.3 - 6.6 mg/dL  ABO/Rh     Status: None   Collection Time: 10/14/15  9:55 AM  Result Value Ref Range   ABO/RH(D) B POS   Protein / creatinine ratio, urine     Status: None   Collection Time: 10/14/15 12:08 PM  Result Value Ref Range   Creatinine, Urine 127.00 mg/dL   Total Protein, Urine 16 mg/dL   Protein Creatinine Ratio 0.13 0.00 - 0.15 mg/mg[Cre]    A/P: 29 y.o. G1P0 @ 165w3d in active labor 1. FWB: Cat. II- pt repositioned, FSE placed, amnioinfusion to be started 2. Labor: continue expectant management, continue Pit per protocol to obtain adequate contractions Pain: continue epidural GBS: negative Gestational HTN: Pt ruled out for preeclampsia, no IV BP medications indicated, will continue to monitor  Myna HidalgoJennifer Yecenia Dalgleish, DO [phone] (pager) [phone] (  office)

## 2015-10-14 NOTE — Progress Notes (Signed)
OB PN:  S: Pt resting comfortably with epidural  O: BP 129/75 mmHg  Pulse 76  Temp(Src) 98.3 F (36.8 C) (Oral)  Resp 18  Ht 5' (1.524 m)  Wt 90.719 kg (200 lb)  BMI 39.06 kg/m2  SpO2 100%  BP range:106-171/58-90  FHT: 140bpm, moderate variablity, + accels, occasional variable decels to 90bpm for ~20sec Toco: q2-154min SVE: 9/C/0  Results for orders placed or performed during the hospital encounter of 10/14/15 (from the past 24 hour(s))  OB RESULT CONSOLE Group B Strep     Status: None   Collection Time: 10/14/15 12:00 AM  Result Value Ref Range   GBS Negative   CBC     Status: None   Collection Time: 10/14/15  9:55 AM  Result Value Ref Range   WBC 7.5 4.0 - 10.5 K/uL   RBC 4.36 3.87 - 5.11 MIL/uL   Hemoglobin 12.5 12.0 - 15.0 g/dL   HCT 16.138.8 09.636.0 - 04.546.0 %   MCV 89.0 78.0 - 100.0 fL   MCH 28.7 26.0 - 34.0 pg   MCHC 32.2 30.0 - 36.0 g/dL   RDW 40.915.1 81.111.5 - 91.415.5 %   Platelets 216 150 - 400 K/uL  [facility] [place]     Status: None   Collection Time: 10/14/15  9:55 AM  Result Value Ref Range   ABO/RH(D) B POS    Antibody Screen NEG    Sample Expiration 10/17/2015   Comprehensive metabolic panel     Status: Abnormal   Collection Time: 10/14/15  9:55 AM  Result Value Ref Range   Sodium 135 135 - 145 mmol/L   Potassium 4.4 3.5 - 5.1 mmol/L   Chloride 102 101 - 111 mmol/L   CO2 21 (L) 22 - 32 mmol/L   Glucose, Bld 92 65 - 99 mg/dL   BUN 13 6 - 20 mg/dL   Creatinine, Ser 7.820.66 0.44 - 1.00 mg/dL   Calcium 9.5 8.9 - 95.610.3 mg/dL   Total Protein 6.3 (L) 6.5 - 8.1 g/dL   Albumin 2.9 (L) 3.5 - 5.0 g/dL   AST 28 15 - 41 U/L   ALT 16 14 - 54 U/L   Alkaline Phosphatase 237 (H) 38 - 126 U/L   Total Bilirubin 0.6 0.3 - 1.2 mg/dL   GFR calc non Af Amer >60 >60 mL/min   GFR calc Af Amer >60 >60 mL/min   Anion gap 12 5 - 15  Uric acid     Status: Abnormal   Collection Time: 10/14/15  9:55 AM  Result Value Ref Range   Uric Acid, Serum 7.3 (H) 2.3 - 6.6  mg/dL  ABO/Rh     Status: None   Collection Time: 10/14/15  9:55 AM  Result Value Ref Range   ABO/RH(D) B POS   Protein / creatinine ratio, urine     Status: None   Collection Time: 10/14/15 12:08 PM  Result Value Ref Range   Creatinine, Urine 127.00 mg/dL   Total Protein, Urine 16 mg/dL   Protein Creatinine Ratio 0.13 0.00 - 0.15 mg/mg[Cre]    A/P: 29 y.o. G1P0 @ 2456w3d in active labor 1. FWB: Cat. II- pt repositioned, will continue close monitoring and consider amnioinfusion if variables continue 2. Labor: continue expectant management, IUPC placed, if contractions inadequate plan for Pit Pain: continue epidural GBS: negative Gestational HTN: Pt ruled out for preeclampsia, no IV BP medications indicated, will continue to monitor  Myna HidalgoJennifer Ellakate Gonsalves, DO [phone] (pager) [phone] (  office)

## 2015-10-14 NOTE — H&P (Signed)
HPI: 29 y/o G1P0 @ 108w3d estimated gestational age (as dated by LMP c/w 20 week ultrasound) presents complaining of leaking of fluid with grossly ruptured membranes.     Light vaginal spotting,   + Uterine Contractions,  + Fetal Movement.  ROS: no HA, no epigastric pain, no visual changes.    Pregnancy complicated by: 1) Tinea versicolor- using Lamisil as needed   Prenatal Transfer Tool  Maternal Diabetes: No Genetic Screening: Normal Maternal Ultrasounds/Referrals: Normal Fetal Ultrasounds or other Referrals:  None Maternal Substance Abuse:  No Significant Maternal Medications:  None Significant Maternal Lab Results: Lab values include: Group B Strep negative   PNL:  GBS negative, Rub Immune, Hep B neg, RPR NR, HIV neg, GC/C neg, glucola:normal Hgb: 12 Blood type: B positive, antibody neg  OBHx: primip PMHx:  Tinea versicolor Meds:  PNV Allergy:   Allergies  Allergen Reactions  . Penicillins Rash    Has patient had a PCN reaction causing immediate rash, facial/tongue/throat swelling, SOB or lightheadedness with hypotension: Yes Has patient had a PCN reaction causing severe rash involving mucus membranes or skin necrosis: Yes Has patient had a PCN reaction that required hospitalization no Has patient had a PCN reaction occurring within the last 10 years: No If all of the above answers are "NO", then may proceed with Cephalosporin use.   SurgHx: none SocHx:   no Tobacco, no  EtOH, no Illicit Drugs  O: BP 157/89 mmHg  Pulse 76  Temp(Src) 98.2 F (36.8 C) (Oral)  Ht 5' (1.524 m)  Wt 90.719 kg (200 lb)  BMI 39.06 kg/m2   Gen. AAOx3, NAD Resp. Regular respiratory effort Abd. Gravid no RUQ pain Extr.  2+ pitting edema, no calf tenderness bilaterally  FHT: 130 baseline, moderate variability, + accels,  no decels Toco: q 3-4 min SVE: 5/90/-2, vertex by exam   Labs:  Results for orders placed or performed during the hospital encounter of 10/14/15 (from the past 24  hour(s))  OB RESULT CONSOLE Group B Strep     Status: None   Collection Time: 10/14/15 12:00 AM  Result Value Ref Range   GBS Negative   CBC     Status: None   Collection Time: 10/14/15  9:55 AM  Result Value Ref Range   WBC 7.5 4.0 - 10.5 K/uL   RBC 4.36 3.87 - 5.11 MIL/uL   Hemoglobin 12.5 12.0 - 15.0 g/dL   HCT 16.1 09.6 - 04.5 %   MCV 89.0 78.0 - 100.0 fL   MCH 28.7 26.0 - 34.0 pg   MCHC 32.2 30.0 - 36.0 g/dL   RDW 40.9 81.1 - 91.4 %   Platelets 216 150 - 400 K/uL  Type and screen [facility] [place]     Status: None   Collection Time: 10/14/15  9:55 AM  Result Value Ref Range   ABO/RH(D) B POS    Antibody Screen NEG    Sample Expiration 10/17/2015   Comprehensive metabolic panel     Status: Abnormal   Collection Time: 10/14/15  9:55 AM  Result Value Ref Range   Sodium 135 135 - 145 mmol/L   Potassium 4.4 3.5 - 5.1 mmol/L   Chloride 102 101 - 111 mmol/L   CO2 21 (L) 22 - 32 mmol/L   Glucose, Bld 92 65 - 99 mg/dL   BUN 13 6 - 20 mg/dL   Creatinine, Ser 7.82 0.44 - 1.00 mg/dL   Calcium 9.5 8.9 - 95.6 mg/dL   Total  Protein 6.3 (L) 6.5 - 8.1 g/dL   Albumin 2.9 (L) 3.5 - 5.0 g/dL   AST 28 15 - 41 U/L   ALT 16 14 - 54 U/L   Alkaline Phosphatase 237 (H) 38 - 126 U/L   Total Bilirubin 0.6 0.3 - 1.2 mg/dL   GFR calc non Af Amer >60 >60 mL/min   GFR calc Af Amer >60 >60 mL/min   Anion gap 12 5 - 15  Uric acid     Status: Abnormal   Collection Time: 10/14/15  9:55 AM  Result Value Ref Range   Uric Acid, Serum 7.3 (H) 2.3 - 6.6 mg/dL  Protein / creatinine ratio, urine     Status: None   Collection Time: 10/14/15 12:08 PM  Result Value Ref Range   Creatinine, Urine 127.00 mg/dL   Total Protein, Urine 16 mg/dL   Protein Creatinine Ratio 0.13 0.00 - 0.15 mg/mg[Cre]    A/P:  29 y.o. G1P0 @ 2628w3d EGA who presents for rupture of membranes transitioning to active labor -FWB:  NICHD Cat I FHTs -Labor: continue with expectant management.  Plan to start Pit if pt is  not having regular contractions -GBS: negative -Gestational HTN- plan to rule out for preeclampsia- labs as above, await PC ratio.  Currently pt asymptomatic  Myna HidalgoJennifer Brion Sossamon, DO [phone] (pager) [phone] (office)

## 2015-10-14 NOTE — Progress Notes (Signed)
OB PN:  S: Pt pushing with good maternal effort  O: BP 123/69 mmHg  Pulse 85  Temp(Src) 100.2 F (37.9 C) (Axillary)  Resp 20  Ht 5' (1.524 m)  Wt 90.719 kg (200 lb)  BMI 39.06 kg/m2  SpO2 100%  BP range:106-171/58-90  FHT: 155, moderate variability, recurrent variable decels to 90 bpm for 30 sec with spontaneous recovery Toco: q2-32min SVE: C/C/0  Results for orders placed or performed during the hospital encounter of 10/14/15 (from the past 24 hour(s))  OB RESULT CONSOLE Group B Strep     Status: None   Collection Time: 10/14/15 12:00 AM  Result Value Ref Range   GBS Negative   CBC     Status: None   Collection Time: 10/14/15  9:55 AM  Result Value Ref Range   WBC 7.5 4.0 - 10.5 K/uL   RBC 4.36 3.87 - 5.11 MIL/uL   Hemoglobin 12.5 12.0 - 15.0 g/dL   HCT 16.1 09.6 - 04.5 %   MCV 89.0 78.0 - 100.0 fL   MCH 28.7 26.0 - 34.0 pg   MCHC 32.2 30.0 - 36.0 g/dL   RDW 40.9 81.1 - 91.4 %   Platelets 216 150 - 400 K/uL  Type and screen [facility] [place]     Status: None   Collection Time: 10/14/15  9:55 AM  Result Value Ref Range   ABO/RH(D) B POS    Antibody Screen NEG    Sample Expiration 10/17/2015   Comprehensive metabolic panel     Status: Abnormal   Collection Time: 10/14/15  9:55 AM  Result Value Ref Range   Sodium 135 135 - 145 mmol/L   Potassium 4.4 3.5 - 5.1 mmol/L   Chloride 102 101 - 111 mmol/L   CO2 21 (L) 22 - 32 mmol/L   Glucose, Bld 92 65 - 99 mg/dL   BUN 13 6 - 20 mg/dL   Creatinine, Ser 7.82 0.44 - 1.00 mg/dL   Calcium 9.5 8.9 - 95.6 mg/dL   Total Protein 6.3 (L) 6.5 - 8.1 g/dL   Albumin 2.9 (L) 3.5 - 5.0 g/dL   AST 28 15 - 41 U/L   ALT 16 14 - 54 U/L   Alkaline Phosphatase 237 (H) 38 - 126 U/L   Total Bilirubin 0.6 0.3 - 1.2 mg/dL   GFR calc non Af Amer >60 >60 mL/min   GFR calc Af Amer >60 >60 mL/min   Anion gap 12 5 - 15  Uric acid     Status: Abnormal   Collection Time: 10/14/15  9:55 AM  Result Value Ref Range   Uric Acid,  Serum 7.3 (H) 2.3 - 6.6 mg/dL  ABO/Rh     Status: None   Collection Time: 10/14/15  9:55 AM  Result Value Ref Range   ABO/RH(D) B POS   Protein / creatinine ratio, urine     Status: None   Collection Time: 10/14/15 12:08 PM  Result Value Ref Range   Creatinine, Urine 127.00 mg/dL   Total Protein, Urine 16 mg/dL   Protein Creatinine Ratio 0.13 0.00 - 0.15 mg/mg[Cre]    A/P: 29 y.o. G1P0 @ [redacted]w[redacted]d in active labor 1. FWB: Cat. II- pt repositioned, FSE and IUPC in place.  Amnioinfusion running 2. Labor: Despite adequate contractions and good maternal effort, no progression has been noted.  Due to arrest of descent along with persistent Cat. II tracing will plan for primary C-section.  Risk benefits and alternatives of cesarean section  were discussed with the patient including but not limited to infection, bleeding, damage to bowel , bladder and baby with the need for further surgery. Pt voiced understanding and desires to proceed.  Pain: continue epidural GBS: negative Gestational HTN: Pt ruled out for preeclampsia, no IV BP medications indicated, will continue to monitor  Myna HidalgoJennifer Leven Hoel, DO [phone] (pager) [phone] (office)

## 2015-10-14 NOTE — Op Note (Signed)
PreOp Diagnosis: 1) Intrauterine pregnancy @ 4859w3d 2) Arrest of descent PostOp Diagnosis: same and OP presentation Procedure: Primary C-section Surgeon: Dr. Myna HidalgoJennifer Manpreet Kemmer Anesthesia: epidural Complications: none EBL: 900cc UOP: 100cc Fluids: 1500cc  Findings: Female from OP presentation with nuchal cord.  Normal uterus, tubes and ovaries bilaterally.  PROCEDURE:  Informed consent was obtained from the patient with risks, benefits, complications, treatment options, and expected outcomes discussed with the patient.  The patient concurred with the proposed plan, giving informed consent with form signed.   The patient was taken to Operating Room, and identified with the procedure verified as C-Section Delivery with Time Out. With induction of anesthesia, the patient was prepped and draped in the usual sterile fashion. A Pfannenstiel incision was made and carried down through the subcutaneous tissue to the fascia. The fascia was incised in the midline and extended transversely. The superior aspect of the fascial incision was grasped with Kochers elevated and the underlying muscle dissected off. The inferior aspect of the facial incision was in similar fashion, grasped elevated and rectus muscles dissected off. The peritoneum was identified and entered. Peritoneal incision was extended longitudinally. The utero-vesical peritoneal reflection was identified and incised transversely with the [facility] scissors, the incision extended laterally, the bladder flap created digitally. Uterine band was noted.  A low transverse uterine incision was made and the infants head delivered atraumatically. After the umbilical cord was clamped and cut cord blood was obtained for evaluation.   The placenta was removed intact and appeared normal. The uterine outline, tubes and ovaries appeared normal. The uterine incision was closed with running locked sutures of 0 Vicryl and a second layer of the same stitch was used in an  imbricating fashion.  Excellent hemostasis was obtained.  The pericolic gutters were then cleared of all clots and debris. Interceed was placed over the incision.  The Alexis retractor was removed.  The peritoneum was closed with 2-0 vicryl in a running fashion. The fascia was then reapproximated with running sutures of 0 Vicryl. The subcutaneous tissue was reapproximated with 2-0 plain gut suture.  The skin was closed with 4-0 vicryl in a subcuticular fashion.  Instrument, sponge, and needle counts were correct prior the abdominal closure and at the conclusion of the case. The patient was taken to recovery in stable condition.  Myna HidalgoJennifer Meli Faley, DO [phone] (pager) [phone] (office)

## 2015-10-15 ENCOUNTER — Encounter (HOSPITAL_COMMUNITY): Payer: Self-pay | Admitting: *Deleted

## 2015-10-15 LAB — CBC
HEMATOCRIT: 31.6 % — AB (ref 36.0–46.0)
Hemoglobin: 10.2 g/dL — ABNORMAL LOW (ref 12.0–15.0)
MCH: 28.5 pg (ref 26.0–34.0)
MCHC: 32.3 g/dL (ref 30.0–36.0)
MCV: 88.3 fL (ref 78.0–100.0)
PLATELETS: 206 10*3/uL (ref 150–400)
RBC: 3.58 MIL/uL — ABNORMAL LOW (ref 3.87–5.11)
RDW: 15.2 % (ref 11.5–15.5)
WBC: 15 10*3/uL — AB (ref 4.0–10.5)

## 2015-10-15 LAB — RPR: RPR: NONREACTIVE

## 2015-10-15 MED ORDER — OXYCODONE-ACETAMINOPHEN 5-325 MG PO TABS: 2.0000 | ORAL_TABLET | ORAL | Status: DC | PRN

## 2015-10-15 MED ORDER — DEXTROSE 5 % IV SOLN
1.0000 ug/kg/h | INTRAVENOUS | Status: DC | PRN
  Filled 2015-10-15: qty 2

## 2015-10-15 MED ORDER — IBUPROFEN 600 MG PO TABS
600.0000 mg | ORAL_TABLET | Freq: Four times a day (QID) | ORAL | Status: DC
  Administered 2015-10-15 – 2015-10-17 (×10): 600 mg via ORAL
  Filled 2015-10-15 (×10): qty 1

## 2015-10-15 MED ORDER — IBUPROFEN 600 MG PO TABS: 600.0000 mg | ORAL_TABLET | Freq: Four times a day (QID) | ORAL | Status: DC | PRN

## 2015-10-15 MED ORDER — OXYTOCIN 40 UNITS IN LACTATED RINGERS INFUSION - SIMPLE MED: 62.5000 mL/h | INTRAVENOUS | Status: AC

## 2015-10-15 MED ORDER — SENNOSIDES-DOCUSATE SODIUM 8.6-50 MG PO TABS
2.0000 | ORAL_TABLET | ORAL | Status: DC
  Administered 2015-10-16 (×2): 2 via ORAL
  Filled 2015-10-15 (×2): qty 2

## 2015-10-15 MED ORDER — MENTHOL 3 MG MT LOZG: 1.0000 | LOZENGE | OROMUCOSAL | Status: DC | PRN

## 2015-10-15 MED ORDER — KETOROLAC TROMETHAMINE 30 MG/ML IJ SOLN
INTRAMUSCULAR | Status: AC
  Administered 2015-10-15: 30 mg via INTRAMUSCULAR
  Filled 2015-10-15: qty 1

## 2015-10-15 MED ORDER — DIPHENHYDRAMINE HCL 25 MG PO CAPS: 25.0000 mg | ORAL_CAPSULE | ORAL | Status: DC | PRN

## 2015-10-15 MED ORDER — NALBUPHINE HCL 10 MG/ML IJ SOLN: 5.0000 mg | Freq: Once | INTRAMUSCULAR | Status: DC | PRN

## 2015-10-15 MED ORDER — LACTATED RINGERS IV SOLN
INTRAVENOUS | Status: DC
  Administered 2015-10-15 (×2): via INTRAVENOUS

## 2015-10-15 MED ORDER — NALOXONE HCL 0.4 MG/ML IJ SOLN: 0.4000 mg | INTRAMUSCULAR | Status: DC | PRN

## 2015-10-15 MED ORDER — TETANUS-DIPHTH-ACELL PERTUSSIS 5-2.5-18.5 LF-MCG/0.5 IM SUSP: 0.5000 mL | Freq: Once | INTRAMUSCULAR | Status: DC

## 2015-10-15 MED ORDER — SIMETHICONE 80 MG PO CHEW: 80.0000 mg | CHEWABLE_TABLET | ORAL | Status: DC | PRN

## 2015-10-15 MED ORDER — ONDANSETRON HCL 4 MG/2ML IJ SOLN: 4.0000 mg | Freq: Three times a day (TID) | INTRAMUSCULAR | Status: DC | PRN

## 2015-10-15 MED ORDER — IBUPROFEN 600 MG PO TABS: 600.0000 mg | ORAL_TABLET | Freq: Four times a day (QID) | ORAL | Status: DC

## 2015-10-15 MED ORDER — OXYCODONE-ACETAMINOPHEN 5-325 MG PO TABS
1.0000 | ORAL_TABLET | ORAL | Status: DC | PRN
  Administered 2015-10-15 – 2015-10-16 (×3): 1 via ORAL
  Filled 2015-10-15 (×3): qty 1

## 2015-10-15 MED ORDER — SIMETHICONE 80 MG PO CHEW
80.0000 mg | CHEWABLE_TABLET | ORAL | Status: DC
  Administered 2015-10-16 (×2): 80 mg via ORAL
  Filled 2015-10-15 (×2): qty 1

## 2015-10-15 MED ORDER — DIPHENHYDRAMINE HCL 25 MG PO CAPS: 25.0000 mg | ORAL_CAPSULE | Freq: Four times a day (QID) | ORAL | Status: DC | PRN

## 2015-10-15 MED ORDER — DIPHENHYDRAMINE HCL 50 MG/ML IJ SOLN: 12.5000 mg | INTRAMUSCULAR | Status: DC | PRN

## 2015-10-15 MED ORDER — ACETAMINOPHEN 325 MG PO TABS: 650.0000 mg | ORAL_TABLET | ORAL | Status: DC | PRN

## 2015-10-15 MED ORDER — SIMETHICONE 80 MG PO CHEW
80.0000 mg | CHEWABLE_TABLET | Freq: Three times a day (TID) | ORAL | Status: DC
  Administered 2015-10-15 – 2015-10-17 (×7): 80 mg via ORAL
  Filled 2015-10-15 (×7): qty 1

## 2015-10-15 MED ORDER — SCOPOLAMINE 1 MG/3DAYS TD PT72
1.0000 | MEDICATED_PATCH | Freq: Once | TRANSDERMAL | Status: DC
  Filled 2015-10-15: qty 1

## 2015-10-15 MED ORDER — WITCH HAZEL-GLYCERIN EX PADS: 1.0000 "application " | MEDICATED_PAD | CUTANEOUS | Status: DC | PRN

## 2015-10-15 MED ORDER — NALBUPHINE HCL 10 MG/ML IJ SOLN
5.0000 mg | INTRAMUSCULAR | Status: DC | PRN
Start: 2015-10-15 — End: 2015-10-17

## 2015-10-15 MED ORDER — SODIUM CHLORIDE 0.9 % IJ SOLN: 3.0000 mL | INTRAMUSCULAR | Status: DC | PRN

## 2015-10-15 MED ORDER — PRENATAL MULTIVITAMIN CH
1.0000 | ORAL_TABLET | Freq: Every day | ORAL | Status: DC
  Administered 2015-10-15 – 2015-10-17 (×3): 1 via ORAL
  Filled 2015-10-15 (×3): qty 1

## 2015-10-15 MED ORDER — ZOLPIDEM TARTRATE 5 MG PO TABS: 5.0000 mg | ORAL_TABLET | Freq: Every evening | ORAL | Status: DC | PRN

## 2015-10-15 MED ORDER — LANOLIN HYDROUS EX OINT: 1.0000 "application " | TOPICAL_OINTMENT | CUTANEOUS | Status: DC | PRN

## 2015-10-15 MED ORDER — DIBUCAINE 1 % RE OINT
1.0000 | TOPICAL_OINTMENT | RECTAL | Status: DC | PRN
Start: 2015-10-15 — End: 2015-10-17

## 2015-10-15 MED ORDER — NALBUPHINE HCL 10 MG/ML IJ SOLN: 5.0000 mg | INTRAMUSCULAR | Status: DC | PRN

## 2015-10-15 MED ORDER — ACETAMINOPHEN 500 MG PO TABS
1000.0000 mg | ORAL_TABLET | Freq: Four times a day (QID) | ORAL | Status: AC
  Administered 2015-10-15: 1000 mg via ORAL
  Filled 2015-10-15: qty 2

## 2015-10-15 NOTE — Addendum Note (Signed)
Addendum  created 10/15/15 1312 by Algis GreenhouseLinda A Charnita Trudel, CRNA   Modules edited: Clinical Notes   Clinical Notes:  File: [id number]

## 2015-10-15 NOTE — Progress Notes (Signed)
Postop Note Day # 1  S:  Patient resting comfortable in bed.  Pain controlled.  Tolerating CLD. + flatus, no BM.  Lochia minimal.  Ambulating without difficulty.  She denies n/v/f/c, SOB, or CP.  Pt plans on breastfeeding.  O: Temp:  [97.7 F (36.5 C)-100.2 F (37.9 C)] 98.6 F (37 C) (12/24 0620) Pulse Rate:  [62-126] 82 (12/24 0630) Resp:  [15-20] 20 (12/24 0620) BP: (106-171)/(58-92) 112/60 mmHg (12/24 0630) SpO2:  [92 %-100 %] 92 % (12/24 0620) Weight:  [90.719 kg (200 lb)] 90.719 kg (200 lb) (12/23 0932)  BP range: 112-140/60-81 (overnight) UOP:1000cc/8hr  Gen: A&Ox3, NAD CV: RRR, no MRG Resp: CTAB Abdomen: soft, NT, ND +BS Uterus: firm, non-tender, below umbilicus Incision: c/d/i, bandage on Ext: 2+ edema, no calf tenderness bilaterally, SCDs in place  Labs:  Recent Labs  10/14/15 0955 10/15/15 0500  HGB 12.5 10.2*    A/P: Pt is a 29 y.o. G1P1001 s/p Primary C-section due to arrest of descent  - Pain well controlled -GU: UOP is adequate, plan to remove foley later today -GI: Advance to general diet as tolerated -Activity: encouraged sitting up to chair and ambulation as tolerated -Prophylaxis: early ambulation, SCDs while in bed -Labs: stable as above -Baby boy circ to be completed -Gestational HTN: BP within normal limits, no medication indicated, will continue to monitor  [name] taking over care today  Myna HidalgoJennifer Leilyn Frayre, DO [phone] (pager) [phone] (office)

## 2015-10-15 NOTE — Lactation Note (Signed)
This note was copied from the chart of Nicole Collins. Lactation Consultation Note  Patient Name: Nicole Collins [id number]'s Date: 10/15/2015 Reason for consult: Initial assessment;Other (Comment) (areola edema , ecessive generalized edema )  Baby has been to the breast x4 - 2 10 mins , 6 mins and attempt. Voiding and stooling .  MBURN started a #16 NS due to semi flat nipples and semi compressible areolas - also due to mom having excessive edema in feet  And generalized. LC reassessed breast tissue and noted the #16 NS was to small , #20 NS fits better. Baby latched onto the #20 NS , few sucks  And back to sleep. Baby skin to skin with mom.  LC explained the benefits of skin to skin and increasing hormones for breast feeding.  Mom already has shells , hand pump, ( [facility] LC recommended to mom and the [facility]  RN to set up a DEBP for post pumping.  Also reviewed hand expressing at consult - few drops noted, baby licked off nipple.  LC explained to mom breast feeding takes some time , sleep wake stage cycle, and hand expressing in between is has benefits. Mother informed of post-discharge support and given phone number to the lactation department, including services for phone call assistance;  out-patient appointments; and breastfeeding support group. List of other breastfeeding resources in the community given in the handout. Encouraged  mother to call for problems or concerns related to breastfeeding.   Maternal Data Has patient been taught Hand Expression?: Yes (few drops, baby licked off )  Feeding Feeding Type: Breast Fed  LATCH Score/Interventions Latch: Grasps breast easily, tongue down, lips flanged, rhythmical sucking. Intervention(s): Adjust position;Assist with latch;Breast massage;Breast compression  Audible Swallowing: None  Type of Nipple: Flat (semi flat , semi compressible areola - edema noted )  Comfort (Breast/Nipple): Soft / non-tender     Hold  (Positioning): Assistance needed to correctly position infant at breast and maintain latch. Intervention(s): Breastfeeding basics reviewed;Support Pillows;Position options;Skin to skin  LATCH Score: 6  Lactation Tools Discussed/Used Tools: Nipple Shields;Pump;Shells Nipple shield size: 16;20;Other (comment) (resized for #20 NS , better fit,) Shell Type: Inverted Breast pump type: Manual (MBURN set mom up with a hand pump , LC asked RN to set up a DEBP ) WIC Program: No   Consult Status Consult Status: Follow-up Date: 10/15/15 Follow-up type: In-patient    Kathrin Greathouseorio, Deavion Strider Ann 10/15/2015, 2:39 PM

## 2015-10-15 NOTE — Anesthesia Postprocedure Evaluation (Signed)
Anesthesia Post Note  Patient: Nicole AbideSamantha Collins  Procedure(s) Performed: Procedure(s) (LRB): CESAREAN SECTION (N/A)  Patient location during evaluation: PACU Anesthesia Type: Epidural Level of consciousness: awake Pain management: pain level controlled Vital Signs Assessment: post-procedure vital signs reviewed and stable Respiratory status: spontaneous breathing Cardiovascular status: stable Postop Assessment: no headache, no backache, epidural receding, patient able to bend at knees and no signs of nausea or vomiting Anesthetic complications: no    Last Vitals:  Filed Vitals:   10/15/15 0000 10/15/15 0015  BP: 123/67 116/73  Pulse: 82 94  Temp:  36.7 C  Resp: 15 20    Last Pain:  Filed Vitals:   10/15/15 0028  PainSc: 1                  Johaan Ryser JR,JOHN Dvid Pendry

## 2015-10-15 NOTE — Anesthesia Postprocedure Evaluation (Signed)
Anesthesia Post Note  Patient: Nicole Collins  Procedure(s) Performed: Procedure(s) (LRB): CESAREAN SECTION (N/A)  Patient location during evaluation: Mother Baby Anesthesia Type: Epidural Level of consciousness: awake Pain management: satisfactory to patient Vital Signs Assessment: post-procedure vital signs reviewed and stable Respiratory status: spontaneous breathing Cardiovascular status: stable Anesthetic complications: no    Last Vitals:  Filed Vitals:   10/15/15 1027 10/15/15 1217  BP: 91/58 100/60  Pulse: 72 77  Temp: 36.9 C   Resp: 18     Last Pain:  Filed Vitals:   10/15/15 1221  PainSc: 0-No pain                 Westyn Keatley

## 2015-10-16 MED ORDER — IBUPROFEN 600 MG PO TABS: 600.0000 mg | ORAL_TABLET | Freq: Four times a day (QID) | ORAL | Status: DC | PRN

## 2015-10-16 NOTE — Progress Notes (Signed)
Subjective: Postpartum Day 2: Cesarean Delivery Patient reports tolerating PO, + flatus and no problems voiding.    Objective: Vital signs in last 24 hours: Temp:  [97.8 F (36.6 C)-98.4 F (36.9 C)] 97.8 F (36.6 C) (12/25 0054) Pulse Rate:  [71-83] 81 (12/25 0054) Resp:  [16-20] 18 (12/25 0054) BP: (100-126)/(54-70) 126/67 mmHg (12/25 0054) SpO2:  [95 %-97 %] 97 % (12/24 2140)  Physical Exam:  General: alert and cooperative Lochia: appropriate Uterine Fundus: firm Incision: healing well DVT Evaluation: No evidence of DVT seen on physical exam.   Recent Labs  10/14/15 0955 10/15/15 0500  HGB 12.5 10.2*  HCT 38.8 31.6*    Assessment/Plan: Status post Cesarean section. Doing well postoperatively.  Continue current care Encouraged ambulation in the halls  Circumcision of infant today.  Discharge home tomorrow .  Nicole Collins J. 10/16/2015, 11:56 AM

## 2015-10-16 NOTE — Lactation Note (Signed)
This note was copied from the chart of Nicole Collins. Lactation Consultation Note  FOllow up visit at 45 hours old.  Mom reports improved feedings this evening with baby more active using NS and seeing colostrum in NS.  Encouraged mom to wake baby every 2 1/2-3 hours for at least 8 feedings in next 24 hours.  Mom is using DEBP, but not collecting EBM yet.  Encouraged mom this is normal and she is already working on hand expression after 15 minutes of pumping.    Baby has had 4.9% weight loss loss night with good output today with 4voids and 4 stools.  LC will discuss with MBU RN possible use of formula to supplement if tonights weight loss indicates need.   Mom denies further concerns.         Patient Name: Nicole Collins WUJWJ'XToday's Date: 10/16/2015 Reason for consult: Follow-up assessment   Maternal Data Has patient been taught Hand Expression?: Yes  Feeding Feeding Type: Breast Fed Length of feed: 20 min  LATCH Score/Interventions Latch: Grasps breast easily, tongue down, lips flanged, rhythmical sucking.  Audible Swallowing: A few with stimulation  Type of Nipple: Flat Intervention(s): Double electric pump  Comfort (Breast/Nipple): Soft / non-tender     Hold (Positioning): Assistance needed to correctly position infant at breast and maintain latch.  LATCH Score: 7  Lactation Tools Discussed/Used Tools: Nipple Shields Nipple shield size: 20   Consult Status Consult Status: Follow-up Date: 10/17/15 Follow-up type: In-patient    Nicole Collins, Arvella MerlesJana Lynn 10/16/2015, 8:14 PM

## 2015-10-17 ENCOUNTER — Encounter (HOSPITAL_COMMUNITY): Payer: Self-pay | Admitting: Obstetrics & Gynecology

## 2015-10-17 MED ORDER — OXYCODONE-ACETAMINOPHEN 5-325 MG PO TABS: 1.0000 | ORAL_TABLET | ORAL | Status: DC | PRN

## 2015-10-17 MED ORDER — BISACODYL 10 MG RE SUPP
10.0000 mg | Freq: Once | RECTAL | Status: AC
  Administered 2015-10-17: 10 mg via RECTAL
  Filled 2015-10-17: qty 1

## 2015-10-17 NOTE — Progress Notes (Addendum)
Subjective: Postpartum Day 3: Cesarean Delivery Patient reports tolerating PO, + flatus and no problems voiding.   She reports constipation. No bowel movement since delivery   Objective: Vital signs in last 24 hours: Temp:  [98.4 F (36.9 C)-98.6 F (37 C)] 98.4 F (36.9 C) (12/26 0525) Pulse Rate:  [89-103] 89 (12/26 0525) Resp:  [18] 18 (12/26 0525) BP: (91-131)/(51-85) 91/51 mmHg (12/26 0525)  Physical Exam:  General: alert and cooperative Lochia: appropriate Uterine Fundus: firm Incision: healing well DVT Evaluation: No evidence of DVT seen on physical exam.   Recent Labs  10/14/15 0955 10/15/15 0500  HGB 12.5 10.2*  HCT 38.8 31.6*    Assessment/Plan: Status post Cesarean section. Doing well postoperatively.  Discharge home with standard precautions and return to clinic in 2 weeks Dulcolax suppository x 1 prior to discharge .  Nicole Collins J. 10/17/2015, 9:07 AM

## 2015-10-17 NOTE — Lactation Note (Signed)
This note was copied from the chart of Nicole Collins. Lactation Consultation Note: follow up visit with mom before DC. Baby asleep skin to skin with mom. Last fed about 1 1/2 hours ago- syringe fed formula. Mom reports he was nursing well before circ but has not been going well since. Using NS- nipples are a little flat. Baby took a few sucks then off to sleep. Mom reports breasts are feeling a little fuller this morning. Mom has insurance but has not called them yet about a pump for home. Offered 2 week rental and mom agreeable. Assisted with pumping. Encouraged to pump -q 2-3 hours especially if he does not nurse well. Reviewed engorgement prevention and treatment. No questions at present. OP appointment made for Tuesday 10/25/15 at 1 pm- first available.  Patient Name: Nicole Collins ZOXWR'UToday's Date: 10/17/2015 Reason for consult: Follow-up assessment   Maternal Data Formula Feeding for Exclusion: No Has patient been taught Hand Expression?: Yes Does the patient have breastfeeding experience prior to this delivery?: No  Feeding Feeding Type: Breast Fed  LATCH Score/Interventions Latch: Too sleepy or reluctant, no latch achieved, no sucking elicited.  Audible Swallowing: None  Type of Nipple: Flat  Comfort (Breast/Nipple): Soft / non-tender     Hold (Positioning): Assistance needed to correctly position infant at breast and maintain latch. Intervention(s): Breastfeeding basics reviewed  LATCH Score: 4  Lactation Tools Discussed/Used WIC Program: No   Consult Status Consult Status: Follow-up Date: 10/25/15 Follow-up type: Out-patient    Pamelia HoitWeeks, Hamilton Marinello D 10/17/2015, 9:43 AM

## 2015-10-17 NOTE — Lactation Note (Signed)
This note was copied from the chart of Nicole Collins. Lactation Consultation Note: 2 week [employer] rental completed..  Patient Name: Nicole Collins ZOXWR'UToday's Date: 10/17/2015 Reason for consult: Follow-up assessment   Maternal Data Formula Feeding for Exclusion: No Has patient been taught Hand Expression?: Yes Does the patient have breastfeeding experience prior to this delivery?: No  Feeding Feeding Type: Breast Fed  LATCH Score/Interventions Lactation Tools Discussed/Used WIC Program: No   Consult Status Consult Status: Follow-up Date: 10/25/15 Follow-up type: Out-patient    Pamelia HoitWeeks, Alexandre Faries D 10/17/2015, 10:29 AM

## 2015-10-17 NOTE — Discharge Summary (Signed)
OB Discharge Summary     Patient Name: Nicole Collins DOB: 07-Jun-1986 MRN: [id number]  Date of admission: 10/14/2015 Delivering MD: Myna Hidalgo   Date of discharge: 10/17/2015  Admitting diagnosis: 38WKS,WATER BROKE Intrauterine pregnancy: [redacted]w[redacted]d     Secondary diagnosis:  Active Problems:   Intrauterine normal pregnancy  Additional problems: None     Discharge diagnosis: Term Pregnancy Delivered                                                                                                Post partum procedures:None  Augmentation: NA  Complications: None  Hospital course:  Onset of Labor With Unplanned C/S  29 y.o. yo G1P1000 at [redacted]w[redacted]d was admitted in Active Labor on 10/14/2015. Patient had a labor course significant for Arrest of Descent. Membrane Rupture Time/Date: 8:00 AM ,10/14/2015   The patient went for cesarean section due to Arrest of Descent, and delivered a Viable infant,10/14/2015  Details of operation can be found in separate operative note. Patient had an uncomplicated postpartum course.  She is ambulating,tolerating a regular diet, passing flatus, and urinating well.  Patient is discharged home in stable condition No discharge date for patient encounter.Marland Kitchen   Physical exam  Filed Vitals:   10/15/15 2140 10/16/15 0054 10/16/15 1720 10/17/15 0525  BP: 106/56 126/67 131/85 91/51  Pulse: 83 81 103 89  Temp: 98.1 F (36.7 C) 97.8 F (36.6 C) 98.6 F (37 C) 98.4 F (36.9 C)  TempSrc:  Oral Oral Oral  Resp: Height:      Weight:      SpO2: 97%      General: alert, cooperative and no distress Lochia: appropriate Uterine Fundus: firm Incision: Healing well with no significant drainage DVT Evaluation: No evidence of DVT seen on physical exam. Labs: Lab Results  Component Value Date   WBC 15.0* 10/15/2015   HGB 10.2* 10/15/2015   HCT 31.6* 10/15/2015   MCV 88.3 10/15/2015   PLT 206 10/15/2015   CMP Latest Ref Rng 10/14/2015  Glucose 65 -  99 mg/dL 92  BUN 6 - 20 mg/dL 13  Creatinine 4.03 - 4.74 mg/dL 2.59  Sodium 563 - 875 mmol/L 135  Potassium 3.5 - 5.1 mmol/L 4.4  Chloride 101 - 111 mmol/L 102  CO2 22 - 32 mmol/L 21(L)  Calcium 8.9 - 10.3 mg/dL 9.5  Total Protein 6.5 - 8.1 g/dL 6.3(L)  Total Bilirubin 0.3 - 1.2 mg/dL 0.6  Alkaline Phos 38 - 126 U/L 237(H)  AST 15 - 41 U/L 28  ALT 14 - 54 U/L 16    Discharge instruction: per After Visit Summary and "Baby and Me Booklet".  After visit meds:    Medication List    TAKE these medications        calcium carbonate 500 MG chewable tablet  Commonly known as:  TUMS - dosed in mg elemental calcium  Chew 2 tablets by mouth daily as needed for indigestion or heartburn.     ibuprofen 600 MG tablet  Commonly known as:  ADVIL,MOTRIN  Take 1 tablet (600 mg  total) by mouth every 6 (six) hours as needed for mild pain.     oxyCODONE-acetaminophen 5-325 MG tablet  Commonly known as:  PERCOCET/ROXICET  Take 1 tablet by mouth every 6 (six) hours as needed.     oxyCODONE-acetaminophen 5-325 MG tablet  Commonly known as:  PERCOCET/ROXICET  Take 1-2 tablets by mouth every 4 (four) hours as needed (for pain scale 4-7).     prenatal multivitamin Tabs tablet  Take 1 tablet by mouth at bedtime.        Diet: routine diet  Activity: Advance as tolerated. Pelvic rest for 6 weeks.   Outpatient follow up:2 weeks Follow up Appt:No future appointments. Follow up Visit:No Follow-up on file.  Postpartum contraception: Not Discussed  Newborn Data: Live born female  Birth Weight: 6 lb 5.4 oz (2875 g) APGAR: 8, 9  Baby Feeding: Breast Disposition:home with mother   10/17/2015 Jessee AversOLE,Octave Montrose J., MD

## 2015-10-25 ENCOUNTER — Ambulatory Visit (HOSPITAL_COMMUNITY): Admit: 2015-10-25 | Payer: Managed Care, Other (non HMO)

## 2015-11-24 ENCOUNTER — Other Ambulatory Visit: Payer: Self-pay | Admitting: Obstetrics & Gynecology

## 2015-11-24 ENCOUNTER — Other Ambulatory Visit (HOSPITAL_COMMUNITY)
Admission: RE | Admit: 2015-11-24 | Discharge: 2015-11-24 | Disposition: A | Discharge status: Home / Self Care | Payer: BLUE CROSS/BLUE SHIELD | Source: Ambulatory Visit | Attending: Obstetrics & Gynecology | Admitting: Obstetrics & Gynecology

## 2015-11-24 DIAGNOSIS — Z01411 Encounter for gynecological examination (general) (routine) with abnormal findings: Secondary | ICD-10-CM | POA: Diagnosis present

## 2015-11-24 DIAGNOSIS — R87810 Cervical high risk human papillomavirus (HPV) DNA test positive: Secondary | ICD-10-CM | POA: Diagnosis not present

## 2015-11-24 DIAGNOSIS — Z1151 Encounter for screening for human papillomavirus (HPV): Secondary | ICD-10-CM | POA: Insufficient documentation

## 2015-11-25 LAB — CYTOLOGY - PAP

## 2015-12-22 ENCOUNTER — Other Ambulatory Visit: Payer: Self-pay | Admitting: Obstetrics & Gynecology

## 2016-01-30 ENCOUNTER — Ambulatory Visit (HOSPITAL_COMMUNITY)
Admission: RE | Admit: 2016-01-30 | Discharge: 2016-01-30 | Disposition: A | Discharge status: Home / Self Care | Payer: BLUE CROSS/BLUE SHIELD | Source: Ambulatory Visit | Attending: Obstetrics & Gynecology | Admitting: Obstetrics & Gynecology

## 2016-01-30 NOTE — Lactation Note (Addendum)
  Lactation Consult  Mother's reason for visit:  Noticeable drop in supply after cervical biopsy and return of menses which was just before Nicole Collins was 3 mos.  Visit Type:  OP Appointment Notes:   Mom has been breastfeeding Nicole Collins using a # 20 NS since birth. He will occasionally latch to the left breast without it.  Feedings last 20-60 minutes.  Today he was observed feeding on the left breast with the NS.  He had difficulty maintaining flanged lips and slid on and off the shaft of the nipple shield during the feeding.  A # 24 NS was exchanged for the 20 because it was tight on mom and also to see if Nicole Collins would attach better which he did not.  He never achieved a rhythmic suck swallow burst pattern though he ate a total of 2.3 oz.  He was then placed onto the bare right breast.  He latched deeper onto the bare breast but increased chewing was noted. The nipple shield was reapplied and though he had longer jaw excursions he again was sliding off and on the shaft of it.  It was noted that as the flow decreases Nicole Collins fidgets quite a bit. Breast compressions increases the flow and he does not seem to handle it well. Though he was happy, he was not relaxed during the feeding. He fed on both sides and then returned to the left. Total intake was 4.7 oz. He usually eats 5-5.5 oz in a bottle.  Mom reported that he had difficulty bottle feeding as well.  Oral evaluation:  Nicole Collins tongue thrusts and holds a gloved finger in the anterior portion of his mouth.  He would not pull a gloved finger deeply into his mouth.  Mom was directed to http://www.taylor.net/feedthebabyllc.com to learn how to do sucking exercises to help him to suckle better. It was also noted that his tongue comes to a point and has central retraction when extended.  Suspect that his oral anatomy is contributing to his feeding difficulties and to the decrease in mom's supply.  Nicole Collins has been gaining weight but it is likely that he has not been draining the breast  well leading to an increase in the feed back inhibitor of lactation.  The increase in FIL coupled with mom's change in hormones is likely decreasing her supply.  Plan is for mom to continue working to increase her MS. She was given a Pharmacist, hospitalresource list to learn more about oral restrictions so that she could make an informed decision about whether to revise or not.  Consult:  Initial Lactation Consultant:  Soyla DryerJoseph, Sahmya Arai  ________________________________________________________________________ Weight today: 12# 12.4oz  [phone]  Baby's Name: Nicole Collins Date of Birth: 10/14/2015 Pediatrician: Nash DimmerQuinlan Gender: female Gestational Age: 6864w3d (At Birth) Birth Weight: 6 lb 5.4 oz (2875 g) Weight at Discharge: Weight: 5 lb 13.8 oz (2660 g)[facility]   10/14/15 2240 10/16/15 0003 10/16/15 2359  Weight: 6 lb 5.4 oz (2875 g) 6 lb 0.5 oz (2735 g) 5 lb 13.8 oz (2660 g)      ________________________________________________________________________  Mother's Name: Nicole Collins ________________________________________________________________________  Breastfeeding History (Post Discharge)  Frequency of breastfeeding:  5 times for 20-60 minutes Output Assessment  Voids:  6+ in 24 hrs.  Color:  Clear yellow Stools:  1-2 in 24 hrs.  Color:  Yellow  ________________________________________________________________________

## 2017-02-26 ENCOUNTER — Other Ambulatory Visit (HOSPITAL_COMMUNITY)
Admission: RE | Admit: 2017-02-26 | Discharge: 2017-02-26 | Disposition: A | Discharge status: Home / Self Care | Payer: BLUE CROSS/BLUE SHIELD | Source: Ambulatory Visit | Attending: Obstetrics & Gynecology | Admitting: Obstetrics & Gynecology

## 2017-02-26 ENCOUNTER — Other Ambulatory Visit: Payer: Self-pay | Admitting: Obstetrics & Gynecology

## 2017-02-26 DIAGNOSIS — Z01419 Encounter for gynecological examination (general) (routine) without abnormal findings: Secondary | ICD-10-CM | POA: Insufficient documentation

## 2017-02-26 DIAGNOSIS — Z1151 Encounter for screening for human papillomavirus (HPV): Secondary | ICD-10-CM | POA: Insufficient documentation

## 2017-02-26 DIAGNOSIS — R87810 Cervical high risk human papillomavirus (HPV) DNA test positive: Secondary | ICD-10-CM | POA: Diagnosis not present

## 2017-02-28 LAB — CYTOLOGY - PAP
DIAGNOSIS: NEGATIVE
HPV (WINDOPATH): DETECTED — AB
HPV 16/18/45 genotyping: NEGATIVE

## 2017-04-16 DIAGNOSIS — D2239 Melanocytic nevi of other parts of face: Secondary | ICD-10-CM | POA: Diagnosis not present

## 2017-04-16 DIAGNOSIS — B360 Pityriasis versicolor: Secondary | ICD-10-CM | POA: Diagnosis not present

## 2017-06-21 DIAGNOSIS — R300 Dysuria: Secondary | ICD-10-CM | POA: Diagnosis not present

## 2017-06-21 DIAGNOSIS — N9089 Other specified noninflammatory disorders of vulva and perineum: Secondary | ICD-10-CM | POA: Diagnosis not present

## 2018-02-27 DIAGNOSIS — Z3480 Encounter for supervision of other normal pregnancy, unspecified trimester: Secondary | ICD-10-CM | POA: Diagnosis not present

## 2018-02-27 DIAGNOSIS — Z3201 Encounter for pregnancy test, result positive: Secondary | ICD-10-CM | POA: Diagnosis not present

## 2018-02-27 DIAGNOSIS — Z3481 Encounter for supervision of other normal pregnancy, first trimester: Secondary | ICD-10-CM | POA: Diagnosis not present

## 2018-03-03 DIAGNOSIS — Z3687 Encounter for antenatal screening for uncertain dates: Secondary | ICD-10-CM | POA: Diagnosis not present

## 2018-03-03 LAB — OB RESULTS CONSOLE GC/CHLAMYDIA
Chlamydia: NEGATIVE
GC PROBE AMP, GENITAL: NEGATIVE

## 2018-03-03 LAB — OB RESULTS CONSOLE RUBELLA ANTIBODY, IGM: RUBELLA: IMMUNE

## 2018-03-03 LAB — OB RESULTS CONSOLE ANTIBODY SCREEN: Antibody Screen: NEGATIVE

## 2018-03-03 LAB — OB RESULTS CONSOLE ABO/RH: RH Type: POSITIVE

## 2018-03-03 LAB — OB RESULTS CONSOLE HEPATITIS B SURFACE ANTIGEN: Hepatitis B Surface Ag: NEGATIVE

## 2018-03-03 LAB — OB RESULTS CONSOLE RPR: RPR: NONREACTIVE

## 2018-03-03 LAB — OB RESULTS CONSOLE HIV ANTIBODY (ROUTINE TESTING): HIV: NONREACTIVE

## 2018-03-14 DIAGNOSIS — Z3481 Encounter for supervision of other normal pregnancy, first trimester: Secondary | ICD-10-CM | POA: Diagnosis not present

## 2018-05-08 DIAGNOSIS — Z3482 Encounter for supervision of other normal pregnancy, second trimester: Secondary | ICD-10-CM | POA: Diagnosis not present

## 2018-06-16 DIAGNOSIS — N760 Acute vaginitis: Secondary | ICD-10-CM | POA: Diagnosis not present

## 2018-06-16 DIAGNOSIS — Z3482 Encounter for supervision of other normal pregnancy, second trimester: Secondary | ICD-10-CM | POA: Diagnosis not present

## 2018-06-16 DIAGNOSIS — Z36 Encounter for antenatal screening for chromosomal anomalies: Secondary | ICD-10-CM | POA: Diagnosis not present

## 2018-07-14 DIAGNOSIS — Z3482 Encounter for supervision of other normal pregnancy, second trimester: Secondary | ICD-10-CM | POA: Diagnosis not present

## 2018-07-14 DIAGNOSIS — O358XX0 Maternal care for other (suspected) fetal abnormality and damage, not applicable or unspecified: Secondary | ICD-10-CM | POA: Diagnosis not present

## 2018-07-14 DIAGNOSIS — Z23 Encounter for immunization: Secondary | ICD-10-CM | POA: Diagnosis not present

## 2018-08-11 DIAGNOSIS — Z23 Encounter for immunization: Secondary | ICD-10-CM | POA: Diagnosis not present

## 2018-08-17 ENCOUNTER — Inpatient Hospital Stay (HOSPITAL_COMMUNITY)
Admission: AD | Admit: 2018-08-17 | Discharge: 2018-08-17 | Disposition: A | Discharge status: Home / Self Care | Payer: BLUE CROSS/BLUE SHIELD | Source: Ambulatory Visit | Attending: Obstetrics & Gynecology | Admitting: Obstetrics & Gynecology

## 2018-08-17 ENCOUNTER — Encounter (HOSPITAL_COMMUNITY): Payer: Self-pay | Admitting: *Deleted

## 2018-08-17 DIAGNOSIS — Z833 Family history of diabetes mellitus: Secondary | ICD-10-CM | POA: Diagnosis not present

## 2018-08-17 DIAGNOSIS — Z9889 Other specified postprocedural states: Secondary | ICD-10-CM | POA: Insufficient documentation

## 2018-08-17 DIAGNOSIS — Z3A31 31 weeks gestation of pregnancy: Secondary | ICD-10-CM

## 2018-08-17 DIAGNOSIS — Z880 Allergy status to penicillin: Secondary | ICD-10-CM | POA: Diagnosis not present

## 2018-08-17 DIAGNOSIS — O4703 False labor before 37 completed weeks of gestation, third trimester: Secondary | ICD-10-CM | POA: Diagnosis not present

## 2018-08-17 DIAGNOSIS — Z3689 Encounter for other specified antenatal screening: Secondary | ICD-10-CM

## 2018-08-17 DIAGNOSIS — O34219 Maternal care for unspecified type scar from previous cesarean delivery: Secondary | ICD-10-CM | POA: Insufficient documentation

## 2018-08-17 DIAGNOSIS — O26893 Other specified pregnancy related conditions, third trimester: Secondary | ICD-10-CM | POA: Diagnosis not present

## 2018-08-17 DIAGNOSIS — R109 Unspecified abdominal pain: Secondary | ICD-10-CM | POA: Diagnosis not present

## 2018-08-17 LAB — URINALYSIS, ROUTINE W REFLEX MICROSCOPIC
BILIRUBIN URINE: NEGATIVE
Glucose, UA: NEGATIVE mg/dL
Hgb urine dipstick: NEGATIVE
Ketones, ur: NEGATIVE mg/dL
Leukocytes, UA: NEGATIVE
Nitrite: NEGATIVE
PH: 7 (ref 5.0–8.0)
Protein, ur: NEGATIVE mg/dL
SPECIFIC GRAVITY, URINE: 1.014 (ref 1.005–1.030)

## 2018-08-17 LAB — WET PREP, GENITAL
Clue Cells Wet Prep HPF POC: NONE SEEN
SPERM: NONE SEEN
Trich, Wet Prep: NONE SEEN
Yeast Wet Prep HPF POC: NONE SEEN

## 2018-08-17 MED ORDER — NIFEDIPINE 10 MG PO CAPS
10.0000 mg | ORAL_CAPSULE | ORAL | Status: AC
  Administered 2018-08-17 (×3): 10 mg via ORAL
  Filled 2018-08-17 (×3): qty 1

## 2018-08-17 MED ORDER — LACTATED RINGERS IV BOLUS
1000.0000 mL | Freq: Once | INTRAVENOUS | Status: AC
  Administered 2018-08-17: 1000 mL via INTRAVENOUS

## 2018-08-17 NOTE — MAU Provider Note (Signed)
History     CSN: [id number]  Arrival date and time:     First Provider Initiated Contact with Patient 08/17/18 1047      Chief Complaint  Patient presents with  . Abdominal Pain  . Contractions   G2P1001 @31 .0 wks here with ctx. Reports waking around 3am, felt sick to stomach and vomited several times. Shortly after she started feeling ctx that were regular until 6:30 then she was able to fall asleep. Ctx restarted while she was at church and she had an episode of feeling bad while she was standing in the choir. Husband says she soaked her clothes with sweat. No further vomiting. Denies VB or LOF. Reports good FM. No urinary sx.   OB History    Gravida  2   Para  1   Term  1   Preterm  0   AB  0   Living  0     SAB  0   TAB  0   Ectopic  0   Multiple  0   Live Births              Past Medical History:  Diagnosis Date  . Medical history non-contributory     Past Surgical History:  Procedure Laterality Date  . CESAREAN SECTION N/A 10/14/2015   Procedure: CESAREAN SECTION;  Surgeon: Myna Hidalgo, DO;  Location: [facility];  Service: Obstetrics;  Laterality: N/A;  . TONSILLECTOMY      Family History  Problem Relation Age of Onset  . Diabetes Father   . Diabetes Maternal Aunt   . Diabetes Paternal Aunt   . Diabetes Maternal Grandmother   . Diabetes Paternal Grandfather     Social History   Tobacco Use  . Smoking status: Never Smoker  . Smokeless tobacco: Never Used  Substance Use Topics  . Alcohol use: No  . Drug use: No    Allergies:  Allergies  Allergen Reactions  . Amoxicillin     REACTION: Hives and swelling  . Penicillins Rash    Has patient had a PCN reaction causing immediate rash, facial/tongue/throat swelling, SOB or lightheadedness with hypotension: Yes Has patient had a PCN reaction causing severe rash involving mucus membranes or skin necrosis: Yes Has patient had a PCN reaction that required hospitalization no Has patient had  a PCN reaction occurring within the last 10 years: No If all of the above answers are "NO", then may proceed with Cephalosporin use.    Medications Prior to Admission  Medication Sig Dispense Refill Last Dose  . calcium carbonate (TUMS - DOSED IN MG ELEMENTAL CALCIUM) 500 MG chewable tablet Chew 2 tablets by mouth daily as needed for indigestion or heartburn.    10/14/2015 at Unknown time  . ibuprofen (ADVIL,MOTRIN) 600 MG tablet Take 1 tablet (600 mg total) by mouth every 6 (six) hours as needed for mild pain. 30 tablet 1   . oxyCODONE-acetaminophen (PERCOCET/ROXICET) 5-325 MG tablet Take 1 tablet by mouth every 6 (six) hours as needed. (Patient not taking: Reported on 10/14/2015) 8 tablet 0 Not Taking at Unknown time  . oxyCODONE-acetaminophen (PERCOCET/ROXICET) 5-325 MG tablet Take 1-2 tablets by mouth every 4 (four) hours as needed (for pain scale 4-7). 30 tablet 0   . Prenatal Vit-Fe Fumarate-FA (PRENATAL MULTIVITAMIN) TABS tablet Take 1 tablet by mouth at bedtime.    10/13/2015 at Unknown time    Review of Systems  Constitutional: Negative for fever.  Gastrointestinal: Positive for abdominal pain, nausea and vomiting.  Genitourinary: Negative for dysuria, vaginal bleeding and vaginal discharge.  Neurological: Negative for syncope.   Physical Exam   Blood pressure 125/74, pulse (!) 102, temperature 97.8 F (36.6 C), temperature source Oral, resp. rate 17, SpO2 100 %, unknown if currently breastfeeding.  Physical Exam  Constitutional: She is oriented to person, place, and time. She appears well-developed and well-nourished. No distress.  HENT:  Head: Normocephalic and atraumatic.  Neck: Normal range of motion.  Respiratory: Effort normal. No respiratory distress.  GI: Soft. She exhibits no distension. There is no tenderness.  gravid  Genitourinary:  Genitourinary Comments: SVE closed/thick  Musculoskeletal: Normal range of motion.  Neurological: She is alert and oriented to  person, place, and time.  Skin: Skin is warm and dry.  Psychiatric: She has a normal mood and affect.  EFM: 135 bpm, mod variability, + accels, no decels Toco: 2-5  Results for orders placed or performed during the hospital encounter of 08/17/18 (from the past 24 hour(s))  Urinalysis, Routine w reflex microscopic     Status: None   Collection Time: 08/17/18 10:32 AM  Result Value Ref Range   Color, Urine YELLOW YELLOW   APPearance CLEAR CLEAR   Specific Gravity, Urine 1.014 1.005 - 1.030   pH 7.0 5.0 - 8.0   Glucose, UA NEGATIVE NEGATIVE mg/dL   Hgb urine dipstick NEGATIVE NEGATIVE   Bilirubin Urine NEGATIVE NEGATIVE   Ketones, ur NEGATIVE NEGATIVE mg/dL   Protein, ur NEGATIVE NEGATIVE mg/dL   Nitrite NEGATIVE NEGATIVE   Leukocytes, UA NEGATIVE NEGATIVE  Wet prep, genital     Status: Abnormal   Collection Time: 08/17/18 10:58 AM  Result Value Ref Range   Yeast Wet Prep HPF POC NONE SEEN NONE SEEN   Trich, Wet Prep NONE SEEN NONE SEEN   Clue Cells Wet Prep HPF POC NONE SEEN NONE SEEN   WBC, Wet Prep HPF POC MODERATE (A) NONE SEEN   Sperm NONE SEEN    MAU Course  Procedures LR 1 L Procardia x3  MDM Labs ordered and reviewed. No evidence of UTI or pelvic infection. Pt feeling much better after fluids and meds. Reports dec in ctx and nausea almost completely resolved. Toco shows irritability with rare ctx. Cervix rechecked and unchanged. Stable for discharge home.   Assessment and Plan   1. [redacted] weeks gestation of pregnancy   2. NST (non-stress test) reactive   3. Preterm uterine contractions in third trimester, antepartum    Discharge home Follow up at [facility] this week PTL precautions Rest Hydrate  Allergies as of 08/17/2018      Reactions   Amoxicillin    REACTION: Hives and swelling   Penicillins Rash   Has patient had a PCN reaction causing immediate rash, facial/tongue/throat swelling, SOB or lightheadedness with hypotension: Yes Has patient had a PCN reaction  causing severe rash involving mucus membranes or skin necrosis: Yes Has patient had a PCN reaction that required hospitalization no Has patient had a PCN reaction occurring within the last 10 years: No If all of the above answers are "NO", then may proceed with Cephalosporin use.      Medication List    STOP taking these medications   ibuprofen 600 MG tablet Commonly known as:  ADVIL,MOTRIN   oxyCODONE-acetaminophen 5-325 MG tablet Commonly known as:  PERCOCET/ROXICET     TAKE these medications   calcium carbonate 500 MG chewable tablet Commonly known as:  TUMS - dosed in mg elemental calcium Chew 2 tablets  by mouth daily as needed for indigestion or heartburn.   prenatal multivitamin Tabs tablet Take 1 tablet by mouth at bedtime.      Donette Larry, CNM 08/17/2018, 10:56 AM

## 2018-08-17 NOTE — Discharge Instructions (Signed)
Braxton Hicks Contractions °Contractions of the uterus can occur throughout pregnancy, but they are not always a sign that you are in labor. You may have practice contractions called Braxton Hicks contractions. These false labor contractions are sometimes confused with true labor. °What are Braxton Hicks contractions? °Braxton Hicks contractions are tightening movements that occur in the muscles of the uterus before labor. Unlike true labor contractions, these contractions do not result in opening (dilation) and thinning of the cervix. Toward the end of pregnancy (32-34 weeks), Braxton Hicks contractions can happen more often and may become stronger. These contractions are sometimes difficult to tell apart from true labor because they can be very uncomfortable. You should not feel embarrassed if you go to the hospital with false labor. °Sometimes, the only way to tell if you are in true labor is for your health care provider to look for changes in the cervix. The health care provider will do a physical exam and may monitor your contractions. If you are not in true labor, the exam should show that your cervix is not dilating and your water has not broken. °If there are other health problems associated with your pregnancy, it is completely safe for you to be sent home with false labor. You may continue to have Braxton Hicks contractions until you go into true labor. °How to tell the difference between true labor and false labor °True labor °· Contractions last 30-70 seconds. °· Contractions become very regular. °· Discomfort is usually felt in the top of the uterus, and it spreads to the lower abdomen and low back. °· Contractions do not go away with walking. °· Contractions usually become more intense and increase in frequency. °· The cervix dilates and gets thinner. °False labor °· Contractions are usually shorter and not as strong as true labor contractions. °· Contractions are usually irregular. °· Contractions  are often felt in the front of the lower abdomen and in the groin. °· Contractions may go away when you walk around or change positions while lying down. °· Contractions get weaker and are shorter-lasting as time goes on. °· The cervix usually does not dilate or become thin. °Follow these instructions at home: °· Take over-the-counter and prescription medicines only as told by your health care provider. °· Keep up with your usual exercises and follow other instructions from your health care provider. °· Eat and drink lightly if you think you are going into labor. °· If Braxton Hicks contractions are making you uncomfortable: °? Change your position from lying down or resting to walking, or change from walking to resting. °? Sit and rest in a tub of warm water. °? Drink enough fluid to keep your urine pale yellow. Dehydration may cause these contractions. °? Do slow and deep breathing several times an hour. °· Keep all follow-up prenatal visits as told by your health care provider. This is important. °Contact a health care provider if: °· You have a fever. °· You have continuous pain in your abdomen. °Get help right away if: °· Your contractions become stronger, more regular, and closer together. °· You have fluid leaking or gushing from your vagina. °· You pass blood-tinged mucus (bloody show). °· You have bleeding from your vagina. °· You have low back pain that you never had before. °· You feel your baby’s head pushing down and causing pelvic pressure. °· Your baby is not moving inside you as much as it used to. °Summary °· Contractions that occur before labor are called Braxton   Hicks contractions, false labor, or practice contractions. °· Braxton Hicks contractions are usually shorter, weaker, farther apart, and less regular than true labor contractions. True labor contractions usually become progressively stronger and regular and they become more frequent. °· Manage discomfort from Braxton Hicks contractions by  changing position, resting in a warm bath, drinking plenty of water, or practicing deep breathing. °This information is not intended to replace advice given to you by your health care provider. Make sure you discuss any questions you have with your health care provider. °Document Released: 02/21/2017 Document Revised: 02/21/2017 Document Reviewed: 02/21/2017 °Elsevier Interactive Patient Education © 2018 [employer] ° °

## 2018-08-19 LAB — GC/CHLAMYDIA PROBE AMP (~~LOC~~) NOT AT ARMC
Chlamydia: NEGATIVE
NEISSERIA GONORRHEA: NEGATIVE

## 2018-09-26 DIAGNOSIS — Z3483 Encounter for supervision of other normal pregnancy, third trimester: Secondary | ICD-10-CM | POA: Diagnosis not present

## 2018-09-29 ENCOUNTER — Encounter (HOSPITAL_COMMUNITY): Payer: Self-pay

## 2018-09-29 ENCOUNTER — Inpatient Hospital Stay (HOSPITAL_COMMUNITY)
Admission: AD | Admit: 2018-09-29 | Discharge: 2018-09-29 | Disposition: A | Discharge status: Home / Self Care | Payer: BLUE CROSS/BLUE SHIELD | Attending: Obstetrics and Gynecology | Admitting: Obstetrics and Gynecology

## 2018-09-29 DIAGNOSIS — O163 Unspecified maternal hypertension, third trimester: Secondary | ICD-10-CM | POA: Diagnosis not present

## 2018-09-29 DIAGNOSIS — O219 Vomiting of pregnancy, unspecified: Secondary | ICD-10-CM

## 2018-09-29 DIAGNOSIS — Z3A37 37 weeks gestation of pregnancy: Secondary | ICD-10-CM

## 2018-09-29 DIAGNOSIS — O26893 Other specified pregnancy related conditions, third trimester: Secondary | ICD-10-CM | POA: Diagnosis not present

## 2018-09-29 DIAGNOSIS — R030 Elevated blood-pressure reading, without diagnosis of hypertension: Secondary | ICD-10-CM | POA: Insufficient documentation

## 2018-09-29 DIAGNOSIS — O212 Late vomiting of pregnancy: Secondary | ICD-10-CM | POA: Diagnosis not present

## 2018-09-29 DIAGNOSIS — Z880 Allergy status to penicillin: Secondary | ICD-10-CM | POA: Insufficient documentation

## 2018-09-29 DIAGNOSIS — O26899 Other specified pregnancy related conditions, unspecified trimester: Secondary | ICD-10-CM

## 2018-09-29 DIAGNOSIS — G5600 Carpal tunnel syndrome, unspecified upper limb: Secondary | ICD-10-CM | POA: Diagnosis not present

## 2018-09-29 LAB — URINALYSIS, ROUTINE W REFLEX MICROSCOPIC
BILIRUBIN URINE: NEGATIVE
Glucose, UA: NEGATIVE mg/dL
HGB URINE DIPSTICK: NEGATIVE
KETONES UR: NEGATIVE mg/dL
Leukocytes, UA: NEGATIVE
NITRITE: NEGATIVE
PROTEIN: NEGATIVE mg/dL
SPECIFIC GRAVITY, URINE: 1.011 (ref 1.005–1.030)
pH: 8 (ref 5.0–8.0)

## 2018-09-29 LAB — PROTEIN / CREATININE RATIO, URINE
Creatinine, Urine: 69 mg/dL
PROTEIN CREATININE RATIO: 0.12 mg/mg{creat} (ref 0.00–0.15)
TOTAL PROTEIN, URINE: 8 mg/dL

## 2018-09-29 LAB — COMPREHENSIVE METABOLIC PANEL
ALBUMIN: 3.1 g/dL — AB (ref 3.5–5.0)
ALT: 13 U/L (ref 0–44)
ANION GAP: 9 (ref 5–15)
AST: 19 U/L (ref 15–41)
Alkaline Phosphatase: 178 U/L — ABNORMAL HIGH (ref 38–126)
BUN: 7 mg/dL (ref 6–20)
CHLORIDE: 105 mmol/L (ref 98–111)
CO2: 21 mmol/L — AB (ref 22–32)
Calcium: 8.6 mg/dL — ABNORMAL LOW (ref 8.9–10.3)
Creatinine, Ser: 0.55 mg/dL (ref 0.44–1.00)
GFR calc non Af Amer: >60 mL/min (ref >60)
GLUCOSE: 83 mg/dL (ref 70–99)
Potassium: 3.8 mmol/L (ref 3.5–5.1)
SODIUM: 135 mmol/L (ref 135–145)
Total Bilirubin: 0.6 mg/dL (ref 0.3–1.2)
Total Protein: 6.7 g/dL (ref 6.5–8.1)

## 2018-09-29 LAB — CBC
HCT: 38 % (ref 36.0–46.0)
HEMOGLOBIN: 12.1 g/dL (ref 12.0–15.0)
MCH: 29.2 pg (ref 26.0–34.0)
MCHC: 31.8 g/dL (ref 30.0–36.0)
MCV: 91.8 fL (ref 80.0–100.0)
Platelets: 222 10*3/uL (ref 150–400)
RBC: 4.14 MIL/uL (ref 3.87–5.11)
RDW: 14.9 % (ref 11.5–15.5)
WBC: 8 10*3/uL (ref 4.0–10.5)
nRBC: 0 % (ref 0.0–0.2)

## 2018-09-29 NOTE — MAU Note (Signed)
Pt presents to MAU with complaints of nausea, vomiting, and diarrhea that started Saturday and it subsided and started back last night at 1030. +FM. Office has been having her take her B/P at home due to it being elevated in the office. Denies any HA

## 2018-09-29 NOTE — MAU Provider Note (Signed)
History     CSN: [id number]  Arrival date and time: 09/29/18 1200   First Provider Initiated Contact with Patient 09/29/18 1303      Chief Complaint  Patient presents with  . Hypertension  . Emesis   HPI Nicole Collins 32 y.o. 265w1d  Comes to MAU with nausea, vomiting on yesterday and some high blood pressures she checked at home last night. 134/100 and 140/91 reported by client - home BP check.  Had Nifedipine for preterm contractions and took one last night.  Advised by [name] to come to be checked in MAU.  Usually sees [name] and has an apointment scheduled for Thursday.  Initial BP was 148/85 in MAU.  Will draw labs and observe.  Has not had a headache or blurred vision.  Has had nausea today but no vomiting today - has not eaten today. Will evalute for preeclampsia.  OB History    Gravida  2   Para  1   Term  1   Preterm  0   AB  0   Living  1     SAB  0   TAB  0   Ectopic  0   Multiple  0   Live Births  1           Past Medical History:  Diagnosis Date  . Medical history non-contributory     Past Surgical History:  Procedure Laterality Date  . CESAREAN SECTION N/A 10/14/2015   Procedure: CESAREAN SECTION;  Surgeon: [name], DO;  Location: [facility];  Service: Obstetrics;  Laterality: N/A;  . TONSILLECTOMY      Family History  Problem Relation Age of Onset  . Diabetes Father   . Diabetes Maternal Aunt   . Diabetes Paternal Aunt   . Diabetes Maternal Grandmother   . Diabetes Paternal Grandfather     Social History   Tobacco Use  . Smoking status: Never Smoker  . Smokeless tobacco: Never Used  Substance Use Topics  . Alcohol use: No  . Drug use: No    Allergies:  Allergies  Allergen Reactions  . Amoxicillin     REACTION: Hives and swelling  . Penicillins Rash    Has patient had a PCN reaction causing immediate rash, facial/tongue/throat swelling, SOB or lightheadedness with hypotension: Yes Has patient had a PCN  reaction causing severe rash involving mucus membranes or skin necrosis: Yes Has patient had a PCN reaction that required hospitalization no Has patient had a PCN reaction occurring within the last 10 years: No If all of the above answers are "NO", then may proceed with Cephalosporin use.    Medications Prior to Admission  Medication Sig Dispense Refill Last Dose  . calcium carbonate (TUMS - DOSED IN MG ELEMENTAL CALCIUM) 500 MG chewable tablet Chew 2 tablets by mouth daily as needed for indigestion or heartburn.    10/14/2015 at Unknown time  . Prenatal Vit-Fe Fumarate-FA (PRENATAL MULTIVITAMIN) TABS tablet Take 1 tablet by mouth at bedtime.    10/13/2015 at Unknown time    Review of Systems  Constitutional: Negative for fever.  Eyes: Negative for visual disturbance.  Cardiovascular: Negative for leg swelling.  Gastrointestinal: Positive for nausea and vomiting. Negative for diarrhea.  Genitourinary: Negative for dysuria, vaginal bleeding and vaginal discharge.  Neurological: Negative for headaches.       Tingling in finger particularly right hand   Physical Exam   Blood pressure 130/78, pulse 86, temperature 98.1 F (36.7 C), resp.  rate 20, weight 90.3 kg, unknown if currently breastfeeding.  Physical Exam  Nursing note and vitals reviewed. Constitutional: She is oriented to person, place, and time. She appears well-developed and well-nourished.  HENT:  Head: Normocephalic.  Eyes: EOM are normal.  Neck: Neck supple.  GI: Soft. There is no tenderness. There is no rebound and no guarding.  FHT 140 baseline with moderate variability.  15x15 accels noted.  Occasional contraction.  No decelerations.  Reactive strip.  Musculoskeletal: Normal range of motion.  Hands slightly swollen and having tingling in there fingers particularly on the right hand.  Neurological: She is alert and oriented to person, place, and time.  Skin: Skin is warm and dry.  Psychiatric: She has a normal mood  and affect.   Vitals:   09/29/18 1356 09/29/18 1401  BP: 113/69 112/68  Pulse: 86 81  Resp:    Temp:      MAU Course  Procedures Results for orders placed or performed during the hospital encounter of 09/29/18 (from the past 24 hour(s))  Urinalysis, Routine w reflex microscopic     Status: None   Collection Time: 09/29/18 12:08 PM  Result Value Ref Range   Color, Urine YELLOW YELLOW   APPearance CLEAR CLEAR   Specific Gravity, Urine 1.011 1.005 - 1.030   pH 8.0 5.0 - 8.0   Glucose, UA NEGATIVE NEGATIVE mg/dL   Hgb urine dipstick NEGATIVE NEGATIVE   Bilirubin Urine NEGATIVE NEGATIVE   Ketones, ur NEGATIVE NEGATIVE mg/dL   Protein, ur NEGATIVE NEGATIVE mg/dL   Nitrite NEGATIVE NEGATIVE   Leukocytes, UA NEGATIVE NEGATIVE  Protein / creatinine ratio, urine     Status: None   Collection Time: 09/29/18 12:20 PM  Result Value Ref Range   Creatinine, Urine 69.00 mg/dL   Total Protein, Urine 8 mg/dL   Protein Creatinine Ratio 0.12 0.00 - 0.15 mg/mg[Cre]  CBC     Status: None   Collection Time: 09/29/18 12:37 PM  Result Value Ref Range   WBC 8.0 4.0 - 10.5 K/uL   RBC 4.14 3.87 - 5.11 MIL/uL   Hemoglobin 12.1 12.0 - 15.0 g/dL   HCT 16.1 09.6 - 04.5 %   MCV 91.8 80.0 - 100.0 fL   MCH 29.2 26.0 - 34.0 pg   MCHC 31.8 30.0 - 36.0 g/dL   RDW 40.9 81.1 - 91.4 %   Platelets 222 150 - 400 K/uL   nRBC 0.0 0.0 - 0.2 %  Comprehensive metabolic panel     Status: Abnormal   Collection Time: 09/29/18 12:37 PM  Result Value Ref Range   Sodium 135 135 - 145 mmol/L   Potassium 3.8 3.5 - 5.1 mmol/L   Chloride 105 98 - 111 mmol/L   CO2 21 (L) 22 - 32 mmol/L   Glucose, Bld 83 70 - 99 mg/dL   BUN 7 6 - 20 mg/dL   Creatinine, Ser 7.82 0.44 - 1.00 mg/dL   Calcium 8.6 (L) 8.9 - 10.3 mg/dL   Total Protein 6.7 6.5 - 8.1 g/dL   Albumin 3.1 (L) 3.5 - 5.0 g/dL   AST 19 15 - 41 U/L   ALT 13 0 - 44 U/L   Alkaline Phosphatase 178 (H) 38 - 126 U/L   Total Bilirubin 0.6 0.3 - 1.2 mg/dL   GFR calc  non Af Amer >60 >60 mL/min   GFR calc Af Amer >60 >60 mL/min   Anion gap 9 5 - 15    MDM Initial BP on  arrival to MAU was 148/85.  All BPs afterwards were less than 140/90 while resting in bed. Reviewed labs with Dr. Macon Large and then with Dr. Dion Body.  Assessment and Plan  Reactive NST elevated BP in pregnancy but labs do not indicated preeclampsia at present Nausea and vomiting Carpal tunnel  Plan Call your office and be seen in the office tomorrow for a blood pressure check. Call the doctor tonight if you have headaches, blurred vision, worse swelling or RUQ pain. Eat small meals and start with liquids and increase to solid foods slowly as you have been vomiting. Avoid fried foods, highly spiced foods and oils. Return to Maternity Admissions with contractions, rupture of membranes, vaginal bleeding or the baby is not moving.  Terri L Burleson 09/29/2018, 1:19 PM

## 2018-09-29 NOTE — Discharge Instructions (Signed)
Call your office and be seen in the office tomorrow for a blood pressure check. Call the doctor tonight if you have headaches, blurred vision, worse swelling or RUQ pain. Eat small meals and start with liquids and increase to solid foods slowly as you have been vomiting. Avoid fried foods, highly spiced foods and oils. Return to Maternity Admissions with contractions, rupture of membranes, vaginal bleeding or the baby is not moving.

## 2018-09-29 NOTE — MAU Note (Signed)
G2P1 at 37.1 weeks send from home for increased BP.  Being followed for Pre-E.  Pt has swelling in hands and feet, no vision changes, no HA, no other neuro symptoms.

## 2018-09-30 ENCOUNTER — Encounter (HOSPITAL_COMMUNITY): Payer: Self-pay

## 2018-10-02 DIAGNOSIS — O358XX0 Maternal care for other (suspected) fetal abnormality and damage, not applicable or unspecified: Secondary | ICD-10-CM | POA: Diagnosis not present

## 2018-10-10 ENCOUNTER — Encounter (HOSPITAL_COMMUNITY)
Admission: RE | Admit: 2018-10-10 | Discharge: 2018-10-10 | Disposition: A | Discharge status: Home / Self Care | Payer: BLUE CROSS/BLUE SHIELD | Source: Ambulatory Visit | Attending: Obstetrics & Gynecology | Admitting: Obstetrics & Gynecology

## 2018-10-10 DIAGNOSIS — O99214 Obesity complicating childbirth: Secondary | ICD-10-CM | POA: Diagnosis not present

## 2018-10-10 DIAGNOSIS — O34211 Maternal care for low transverse scar from previous cesarean delivery: Secondary | ICD-10-CM | POA: Diagnosis not present

## 2018-10-10 DIAGNOSIS — O358XX0 Maternal care for other (suspected) fetal abnormality and damage, not applicable or unspecified: Secondary | ICD-10-CM | POA: Diagnosis not present

## 2018-10-10 DIAGNOSIS — Z98891 History of uterine scar from previous surgery: Secondary | ICD-10-CM | POA: Diagnosis not present

## 2018-10-10 DIAGNOSIS — O34219 Maternal care for unspecified type scar from previous cesarean delivery: Secondary | ICD-10-CM | POA: Diagnosis not present

## 2018-10-10 DIAGNOSIS — Z01812 Encounter for preprocedural laboratory examination: Secondary | ICD-10-CM

## 2018-10-10 DIAGNOSIS — R011 Cardiac murmur, unspecified: Secondary | ICD-10-CM | POA: Diagnosis not present

## 2018-10-10 DIAGNOSIS — N433 Hydrocele, unspecified: Secondary | ICD-10-CM | POA: Diagnosis not present

## 2018-10-10 DIAGNOSIS — Z880 Allergy status to penicillin: Secondary | ICD-10-CM | POA: Diagnosis not present

## 2018-10-10 DIAGNOSIS — Z412 Encounter for routine and ritual male circumcision: Secondary | ICD-10-CM | POA: Diagnosis not present

## 2018-10-10 DIAGNOSIS — Z3A39 39 weeks gestation of pregnancy: Secondary | ICD-10-CM | POA: Diagnosis not present

## 2018-10-10 LAB — CBC
HCT: 38.1 % (ref 36.0–46.0)
Hemoglobin: 12.1 g/dL (ref 12.0–15.0)
MCH: 29.4 pg (ref 26.0–34.0)
MCHC: 31.8 g/dL (ref 30.0–36.0)
MCV: 92.5 fL (ref 80.0–100.0)
Platelets: 203 10*3/uL (ref 150–400)
RBC: 4.12 MIL/uL (ref 3.87–5.11)
RDW: 15.1 % (ref 11.5–15.5)
WBC: 7.2 10*3/uL (ref 4.0–10.5)
nRBC: 0 % (ref 0.0–0.2)

## 2018-10-10 LAB — TYPE AND SCREEN
ABO/RH(D): B POS
ANTIBODY SCREEN: NEGATIVE

## 2018-10-10 LAB — ABO/RH: ABO/RH(D): B POS

## 2018-10-10 NOTE — Patient Instructions (Signed)
Gaynelle AduSamantha Iten  10/10/2018   Your procedure is scheduled on:  10/13/2018  [facility].  Pick up the phone at the desk and dial [phone]  Call this number if you have problems the morning of surgery:[phone]  Remember:   Do not eat food:(After Midnight) Desps de medianoche.  Do not drink clear liquids: (After Midnight) Desps de medianoche.  Take these medicines the morning of surgery with A SIP OF WATER: none   Do not wear jewelry, make-up or nail polish.  Do not wear lotions, powders, or perfumes. Do not wear deodorant.  Do not shave 48 hours prior to surgery.  Do not bring valuables to the hospital.  [facility] is not   responsible for any belongings or valuables brought to the hospital.  Contacts, dentures or bridgework may not be worn into surgery.  Leave suitcase in the car. After surgery it may be brought to your room.  For patients admitted to the hospital, checkout time is 11:00 AM the day of              discharge.    N/A   Please read over the following fact sheets that you were given:   Surgical Site Infection Prevention

## 2018-10-11 DIAGNOSIS — Z3490 Encounter for supervision of normal pregnancy, unspecified, unspecified trimester: Secondary | ICD-10-CM

## 2018-10-11 LAB — RPR: RPR Ser Ql: NONREACTIVE

## 2018-10-11 NOTE — H&P (Signed)
HPI: 32 y/o G2P1001 @ 1571w1d estimated gestational age (as dated by LMP c/w 20 week ultrasound) presents for scheduled repeat C-section.   no Leaking of Fluid,   no Vaginal Bleeding,   irregular Uterine Contractions,  + Fetal Movement.  Prenatal care has been provided by Dr. Charlotta Newtonzan  ROS: no HA, no epigastric pain, no visual changes.    Pregnancy complicated by: 1) Prior C-section, desires repeat 2) Fetal bilateral pyelectasis   Prenatal Transfer Tool  Maternal Diabetes: No Genetic Screening: Normal Maternal Ultrasounds/Referrals: Abnormal:  Findings:   Fetal renal pyelectasis Fetal Ultrasounds or other Referrals:  None Maternal Substance Abuse:  No Significant Maternal Medications:  None Significant Maternal Lab Results: Lab values include: Group B Strep negative   PNL:  GBS neg, Rub Immune, Hep B neg, RPR NR, HIV neg, GC/C neg, glucola:118 Hgb: 11.8 Blood type: B positive, antibody neg  Immunizations: Tdap: 10/24 Flu: 9/23  OBHx: 10/14/2015- FT Primary C-section- OP presentation, fetal intolerance to labor, 6#7oz, "Jorene MinorsJameson" PMHx:  none Meds:  PNV Allergy:   Allergies  Allergen Reactions  . Amoxicillin     REACTION: Hives and swelling  . Penicillins Rash    Has patient had a PCN reaction causing immediate rash, facial/tongue/throat swelling, SOB or lightheadedness with hypotension: Yes Has patient had a PCN reaction causing severe rash involving mucus membranes or skin necrosis: Yes Has patient had a PCN reaction that required hospitalization no Has patient had a PCN reaction occurring within the last 10 years: No If all of the above answers are "NO", then may proceed with Cephalosporin use.   SurgHx: Primary C-section SocHx:   no Tobacco, no  EtOH, no Illicit Drugs  O: Vital to be obtained on arrival Examination in office Gen. AAOx3, NAD CV.  RRR Resp. CTAB, no wheeze or crackles. Abd. Gravid,  no tenderness,  no rigidity,  no guarding Extr.  1+ edema B/L , no calf  tenderness, neg Homan's B/L FHT: 140 by doppler  Last US: @ 1160w1d- vertex/post/female/EFW 6#11oz  Labs: see orders  A/P:  32 y.o. G2P1001 @ 1971w1d EGA who presents for scheduled repeat C-section -FWB:  Reassuring by doppler -NPO -LR @ 125cc/hr -Gent/Clinda IV to OR -SCDs to OR -Risk benefits and alternatives of cesarean section were discussed with the patient including but not limited to infection, bleeding, damage to bowel , bladder and baby with the need for further surgery. Pt voiced understanding and desires to proceed.   Myna HidalgoJennifer Kimbria Camposano, DO [phone] (cell) [phone] (office)

## 2018-10-13 ENCOUNTER — Inpatient Hospital Stay (HOSPITAL_COMMUNITY)
Admission: AD | Admit: 2018-10-13 | Discharge: 2018-10-15 | DRG: 788 | Disposition: A | Discharge status: Home / Self Care | Payer: BLUE CROSS/BLUE SHIELD | Attending: Obstetrics & Gynecology | Admitting: Obstetrics & Gynecology

## 2018-10-13 ENCOUNTER — Inpatient Hospital Stay (HOSPITAL_COMMUNITY): Payer: BLUE CROSS/BLUE SHIELD

## 2018-10-13 ENCOUNTER — Encounter (HOSPITAL_COMMUNITY)
Admission: AD | Disposition: A | Discharge status: Home / Self Care | Payer: Self-pay | Source: Home / Self Care | Attending: Obstetrics & Gynecology

## 2018-10-13 ENCOUNTER — Encounter (HOSPITAL_COMMUNITY): Payer: Self-pay | Admitting: General Practice

## 2018-10-13 DIAGNOSIS — R011 Cardiac murmur, unspecified: Secondary | ICD-10-CM | POA: Diagnosis not present

## 2018-10-13 DIAGNOSIS — Z3A39 39 weeks gestation of pregnancy: Secondary | ICD-10-CM | POA: Diagnosis not present

## 2018-10-13 DIAGNOSIS — O99214 Obesity complicating childbirth: Secondary | ICD-10-CM | POA: Diagnosis present

## 2018-10-13 DIAGNOSIS — Z3490 Encounter for supervision of normal pregnancy, unspecified, unspecified trimester: Secondary | ICD-10-CM

## 2018-10-13 DIAGNOSIS — O358XX0 Maternal care for other (suspected) fetal abnormality and damage, not applicable or unspecified: Secondary | ICD-10-CM | POA: Diagnosis present

## 2018-10-13 DIAGNOSIS — O34211 Maternal care for low transverse scar from previous cesarean delivery: Secondary | ICD-10-CM | POA: Diagnosis present

## 2018-10-13 DIAGNOSIS — Z98891 History of uterine scar from previous surgery: Secondary | ICD-10-CM | POA: Diagnosis not present

## 2018-10-13 DIAGNOSIS — Z880 Allergy status to penicillin: Secondary | ICD-10-CM

## 2018-10-13 DIAGNOSIS — N433 Hydrocele, unspecified: Secondary | ICD-10-CM | POA: Diagnosis not present

## 2018-10-13 DIAGNOSIS — Z3493 Encounter for supervision of normal pregnancy, unspecified, third trimester: Secondary | ICD-10-CM

## 2018-10-13 DIAGNOSIS — O34219 Maternal care for unspecified type scar from previous cesarean delivery: Secondary | ICD-10-CM | POA: Diagnosis not present

## 2018-10-13 DIAGNOSIS — Z412 Encounter for routine and ritual male circumcision: Secondary | ICD-10-CM | POA: Diagnosis not present

## 2018-10-13 SURGERY — Surgical Case
Anesthesia: Spinal

## 2018-10-13 MED ORDER — NALBUPHINE HCL 10 MG/ML IJ SOLN: 5.0000 mg | Freq: Once | INTRAMUSCULAR | Status: DC | PRN

## 2018-10-13 MED ORDER — DIPHENHYDRAMINE HCL 25 MG PO CAPS
25.0000 mg | ORAL_CAPSULE | ORAL | Status: DC | PRN
  Filled 2018-10-13: qty 1

## 2018-10-13 MED ORDER — LACTATED RINGERS IV SOLN
INTRAVENOUS | Status: DC | PRN
  Administered 2018-10-13: 08:00:00 via INTRAVENOUS

## 2018-10-13 MED ORDER — OXYTOCIN 10 UNIT/ML IJ SOLN
INTRAMUSCULAR | Status: AC
  Filled 2018-10-13: qty 4

## 2018-10-13 MED ORDER — SCOPOLAMINE 1 MG/3DAYS TD PT72
MEDICATED_PATCH | TRANSDERMAL | Status: DC | PRN
  Administered 2018-10-13: 1 via TRANSDERMAL

## 2018-10-13 MED ORDER — SIMETHICONE 80 MG PO CHEW
80.0000 mg | CHEWABLE_TABLET | Freq: Three times a day (TID) | ORAL | Status: DC
  Administered 2018-10-13 – 2018-10-15 (×5): 80 mg via ORAL
  Filled 2018-10-13 (×5): qty 1

## 2018-10-13 MED ORDER — MEPERIDINE HCL 25 MG/ML IJ SOLN: 6.2500 mg | INTRAMUSCULAR | Status: DC | PRN

## 2018-10-13 MED ORDER — DEXAMETHASONE SODIUM PHOSPHATE 4 MG/ML IJ SOLN
INTRAMUSCULAR | Status: AC
  Filled 2018-10-13: qty 1

## 2018-10-13 MED ORDER — DIBUCAINE 1 % RE OINT: 1.0000 "application " | TOPICAL_OINTMENT | RECTAL | Status: DC | PRN

## 2018-10-13 MED ORDER — PHENYLEPHRINE 8 MG IN D5W 100 ML (0.08MG/ML) PREMIX OPTIME
INJECTION | INTRAVENOUS | Status: DC | PRN
  Administered 2018-10-13: 60 ug/min via INTRAVENOUS

## 2018-10-13 MED ORDER — OXYCODONE HCL 5 MG/5ML PO SOLN: 5.0000 mg | Freq: Once | ORAL | Status: DC | PRN

## 2018-10-13 MED ORDER — FENTANYL CITRATE (PF) 100 MCG/2ML IJ SOLN
INTRAMUSCULAR | Status: DC | PRN
  Administered 2018-10-13: 15 ug via INTRATHECAL

## 2018-10-13 MED ORDER — ONDANSETRON HCL 4 MG/2ML IJ SOLN
INTRAMUSCULAR | Status: DC | PRN
  Administered 2018-10-13: 4 mg via INTRAVENOUS

## 2018-10-13 MED ORDER — SODIUM CHLORIDE 0.9 % IR SOLN
Status: DC | PRN
  Administered 2018-10-13: 1

## 2018-10-13 MED ORDER — NALBUPHINE HCL 10 MG/ML IJ SOLN: 5.0000 mg | INTRAMUSCULAR | Status: DC | PRN

## 2018-10-13 MED ORDER — SCOPOLAMINE 1 MG/3DAYS TD PT72
MEDICATED_PATCH | TRANSDERMAL | Status: AC
  Filled 2018-10-13: qty 1

## 2018-10-13 MED ORDER — HYDROMORPHONE HCL 1 MG/ML IJ SOLN: 0.2500 mg | INTRAMUSCULAR | Status: DC | PRN

## 2018-10-13 MED ORDER — ONDANSETRON HCL 4 MG/2ML IJ SOLN
INTRAMUSCULAR | Status: AC
  Filled 2018-10-13: qty 2

## 2018-10-13 MED ORDER — MENTHOL 3 MG MT LOZG: 1.0000 | LOZENGE | OROMUCOSAL | Status: DC | PRN

## 2018-10-13 MED ORDER — SODIUM CHLORIDE 0.9% FLUSH: 3.0000 mL | INTRAVENOUS | Status: DC | PRN

## 2018-10-13 MED ORDER — DIPHENHYDRAMINE HCL 25 MG PO CAPS: 25.0000 mg | ORAL_CAPSULE | Freq: Four times a day (QID) | ORAL | Status: DC | PRN

## 2018-10-13 MED ORDER — PROMETHAZINE HCL 25 MG/ML IJ SOLN: 6.2500 mg | INTRAMUSCULAR | Status: DC | PRN

## 2018-10-13 MED ORDER — SIMETHICONE 80 MG PO CHEW
80.0000 mg | CHEWABLE_TABLET | ORAL | Status: DC
  Administered 2018-10-14 (×2): 80 mg via ORAL
  Filled 2018-10-13 (×2): qty 1

## 2018-10-13 MED ORDER — SIMETHICONE 80 MG PO CHEW: 80.0000 mg | CHEWABLE_TABLET | ORAL | Status: DC | PRN

## 2018-10-13 MED ORDER — LACTATED RINGERS IV SOLN
INTRAVENOUS | Status: DC
  Administered 2018-10-13 (×3): via INTRAVENOUS

## 2018-10-13 MED ORDER — OXYCODONE HCL 5 MG PO TABS: 5.0000 mg | ORAL_TABLET | ORAL | Status: DC | PRN

## 2018-10-13 MED ORDER — COCONUT OIL OIL: 1.0000 "application " | TOPICAL_OIL | Status: DC | PRN

## 2018-10-13 MED ORDER — WITCH HAZEL-GLYCERIN EX PADS: 1.0000 "application " | MEDICATED_PAD | CUTANEOUS | Status: DC | PRN

## 2018-10-13 MED ORDER — DIPHENHYDRAMINE HCL 50 MG/ML IJ SOLN: 12.5000 mg | INTRAMUSCULAR | Status: DC | PRN

## 2018-10-13 MED ORDER — OXYTOCIN 40 UNITS IN LACTATED RINGERS INFUSION - SIMPLE MED: 2.5000 [IU]/h | INTRAVENOUS | Status: AC

## 2018-10-13 MED ORDER — NALOXONE HCL 4 MG/10ML IJ SOLN
1.0000 ug/kg/h | INTRAVENOUS | Status: DC | PRN
  Filled 2018-10-13: qty 5

## 2018-10-13 MED ORDER — SENNOSIDES-DOCUSATE SODIUM 8.6-50 MG PO TABS
2.0000 | ORAL_TABLET | ORAL | Status: DC
  Administered 2018-10-14 (×2): 2 via ORAL
  Filled 2018-10-13 (×2): qty 2

## 2018-10-13 MED ORDER — ONDANSETRON HCL 4 MG/2ML IJ SOLN: 4.0000 mg | Freq: Three times a day (TID) | INTRAMUSCULAR | Status: DC | PRN

## 2018-10-13 MED ORDER — OXYTOCIN 10 UNIT/ML IJ SOLN
INTRAVENOUS | Status: DC | PRN
  Administered 2018-10-13: 5 [IU] via INTRAVENOUS
  Administered 2018-10-13: 40 [IU] via INTRAVENOUS

## 2018-10-13 MED ORDER — PRENATAL MULTIVITAMIN CH
1.0000 | ORAL_TABLET | Freq: Every day | ORAL | Status: DC
  Administered 2018-10-14: 1 via ORAL
  Filled 2018-10-13 (×2): qty 1

## 2018-10-13 MED ORDER — SCOPOLAMINE 1 MG/3DAYS TD PT72: 1.0000 | MEDICATED_PATCH | Freq: Once | TRANSDERMAL | Status: DC

## 2018-10-13 MED ORDER — ACETAMINOPHEN 500 MG PO TABS
1000.0000 mg | ORAL_TABLET | Freq: Four times a day (QID) | ORAL | Status: DC
  Administered 2018-10-13 – 2018-10-15 (×8): 1000 mg via ORAL
  Filled 2018-10-13 (×8): qty 2

## 2018-10-13 MED ORDER — GENTAMICIN SULFATE 40 MG/ML IJ SOLN
INTRAVENOUS | Status: AC
  Administered 2018-10-13: 320 mg via INTRAVENOUS
  Filled 2018-10-13: qty 8

## 2018-10-13 MED ORDER — IBUPROFEN 800 MG PO TABS
800.0000 mg | ORAL_TABLET | Freq: Three times a day (TID) | ORAL | Status: DC
  Administered 2018-10-14 – 2018-10-15 (×4): 800 mg via ORAL
  Filled 2018-10-13 (×4): qty 1

## 2018-10-13 MED ORDER — MORPHINE SULFATE (PF) 0.5 MG/ML IJ SOLN
INTRAMUSCULAR | Status: AC
  Filled 2018-10-13: qty 10

## 2018-10-13 MED ORDER — OXYCODONE HCL 5 MG PO TABS: 5.0000 mg | ORAL_TABLET | Freq: Once | ORAL | Status: DC | PRN

## 2018-10-13 MED ORDER — ZOLPIDEM TARTRATE 5 MG PO TABS: 5.0000 mg | ORAL_TABLET | Freq: Every evening | ORAL | Status: DC | PRN

## 2018-10-13 MED ORDER — FENTANYL CITRATE (PF) 100 MCG/2ML IJ SOLN
INTRAMUSCULAR | Status: AC
  Filled 2018-10-13: qty 2

## 2018-10-13 MED ORDER — MORPHINE SULFATE (PF) 0.5 MG/ML IJ SOLN
INTRAMUSCULAR | Status: DC | PRN
  Administered 2018-10-13: .15 mg via INTRATHECAL

## 2018-10-13 MED ORDER — NALOXONE HCL 0.4 MG/ML IJ SOLN: 0.4000 mg | INTRAMUSCULAR | Status: DC | PRN

## 2018-10-13 MED ORDER — LACTATED RINGERS IV SOLN
INTRAVENOUS | Status: DC
  Administered 2018-10-13: 23:00:00 via INTRAVENOUS

## 2018-10-13 MED ORDER — BUPIVACAINE IN DEXTROSE 0.75-8.25 % IT SOLN
INTRATHECAL | Status: DC | PRN
  Administered 2018-10-13: 1.6 mL via INTRATHECAL

## 2018-10-13 MED ORDER — KETOROLAC TROMETHAMINE 30 MG/ML IJ SOLN: 30.0000 mg | Freq: Once | INTRAMUSCULAR | Status: DC | PRN

## 2018-10-13 MED ORDER — GLYCOPYRROLATE 0.2 MG/ML IJ SOLN
INTRAMUSCULAR | Status: DC | PRN
  Administered 2018-10-13: 0.2 mg via INTRAVENOUS

## 2018-10-13 MED ORDER — DEXAMETHASONE SODIUM PHOSPHATE 4 MG/ML IJ SOLN
INTRAMUSCULAR | Status: DC | PRN
  Administered 2018-10-13: 4 mg via INTRAVENOUS

## 2018-10-13 SURGICAL SUPPLY — 42 items
ADH SKN CLS APL DERMABOND .7 (GAUZE/BANDAGES/DRESSINGS)
APL SKNCLS STERI-STRIP NONHPOA (GAUZE/BANDAGES/DRESSINGS) ×1
BARRIER ADHS 3X4 INTERCEED (GAUZE/BANDAGES/DRESSINGS) ×1 IMPLANT
BENZOIN TINCTURE PRP APPL 2/3 (GAUZE/BANDAGES/DRESSINGS) ×2 IMPLANT
BRR ADH 4X3 ABS CNTRL BYND (GAUZE/BANDAGES/DRESSINGS) ×1
CHLORAPREP W/TINT 26ML (MISCELLANEOUS) ×2 IMPLANT
CLAMP CORD UMBIL (MISCELLANEOUS) IMPLANT
CLOTH BEACON ORANGE TIMEOUT ST (SAFETY) ×2 IMPLANT
DERMABOND ADVANCED (GAUZE/BANDAGES/DRESSINGS)
DERMABOND ADVANCED .7 DNX12 (GAUZE/BANDAGES/DRESSINGS) IMPLANT
DRSG OPSITE POSTOP 4X10 (GAUZE/BANDAGES/DRESSINGS) ×2 IMPLANT
ELECT REM PT RETURN 9FT ADLT (ELECTROSURGICAL) ×2
ELECTRODE REM PT RTRN 9FT ADLT (ELECTROSURGICAL) ×1 IMPLANT
EXTRACTOR VACUUM KIWI (MISCELLANEOUS) IMPLANT
GLOVE BIOGEL PI IND STRL 7.0 (GLOVE) ×3 IMPLANT
GLOVE BIOGEL PI INDICATOR 7.0 (GLOVE) ×3
GLOVE ECLIPSE 6.5 STRL STRAW (GLOVE) ×2 IMPLANT
GOWN STRL REUS W/TWL LRG LVL3 (GOWN DISPOSABLE) ×6 IMPLANT
KIT ABG SYR 3ML LUER SLIP (SYRINGE) IMPLANT
NDL HYPO 25X5/8 SAFETYGLIDE (NEEDLE) IMPLANT
NEEDLE HYPO 25X5/8 SAFETYGLIDE (NEEDLE) IMPLANT
NS IRRIG 1000ML POUR BTL (IV SOLUTION) ×2 IMPLANT
PACK C SECTION WH (CUSTOM PROCEDURE TRAY) ×2 IMPLANT
PAD ABD 7.5X8 STRL (GAUZE/BANDAGES/DRESSINGS) ×2 IMPLANT
PAD OB MATERNITY 4.3X12.25 (PERSONAL CARE ITEMS) ×2 IMPLANT
PENCIL SMOKE EVAC W/HOLSTER (ELECTROSURGICAL) ×2 IMPLANT
RTRCTR C-SECT PINK 25CM LRG (MISCELLANEOUS) ×2 IMPLANT
SPONGE LAP 18X18 RF (DISPOSABLE) ×6 IMPLANT
STRIP CLOSURE SKIN 1/2X4 (GAUZE/BANDAGES/DRESSINGS) ×2 IMPLANT
SUT PLAIN 0 NONE (SUTURE) IMPLANT
SUT PLAIN 2 0 (SUTURE) ×2
SUT PLAIN 2 0 XLH (SUTURE) IMPLANT
SUT PLAIN ABS 2-0 CT1 27XMFL (SUTURE) IMPLANT
SUT VIC AB 0 CT1 27 (SUTURE) ×4
SUT VIC AB 0 CT1 27XBRD ANBCTR (SUTURE) ×2 IMPLANT
SUT VIC AB 0 CTX 36 (SUTURE) ×6
SUT VIC AB 0 CTX36XBRD ANBCTRL (SUTURE) ×3 IMPLANT
SUT VIC AB 2-0 CT1 27 (SUTURE) ×2
SUT VIC AB 2-0 CT1 TAPERPNT 27 (SUTURE) ×1 IMPLANT
SUT VIC AB 4-0 KS 27 (SUTURE) ×2 IMPLANT
TOWEL OR 17X24 6PK STRL BLUE (TOWEL DISPOSABLE) ×2 IMPLANT
TRAY FOLEY W/BAG SLVR 14FR LF (SET/KITS/TRAYS/PACK) IMPLANT

## 2018-10-13 NOTE — Transfer of Care (Signed)
Immediate Anesthesia Transfer of Care Note  Patient: Nicole Collins  Procedure(s) Performed: REPEAT CESAREAN SECTION (N/A )  Patient Location: PACU  Anesthesia Type:Spinal  Level of Consciousness: awake, alert  and oriented  Airway & Oxygen Therapy: Patient Spontanous Breathing  Post-op Assessment: Report given to RN and Post -op Vital signs reviewed and stable  Post vital signs: Reviewed and stable  Last Vitals:  Vitals Value Taken Time  BP    Temp    Pulse 81 10/13/2018  8:33 AM  Resp 0 10/13/2018  8:33 AM  SpO2 95 % 10/13/2018  8:33 AM  Vitals shown include unvalidated device data.  Last Pain:  Vitals:   10/13/18 0555  TempSrc: Oral  PainSc: 0-No pain         Complications: No apparent anesthesia complications

## 2018-10-13 NOTE — Anesthesia Procedure Notes (Signed)
Spinal  Patient location during procedure: OB Start time: 10/13/2018 7:23 AM End time: 10/13/2018 7:28 AM Staffing Anesthesiologist: Lowella CurbMiller, Warren Ray, MD Performed: anesthesiologist  Preanesthetic Checklist Completed: patient identified, surgical consent, pre-op evaluation, timeout performed, IV checked, risks and benefits discussed and monitors and equipment checked Spinal Block Patient position: sitting Prep: site prepped and draped and DuraPrep Patient monitoring: heart rate, cardiac monitor, continuous pulse ox and blood pressure Approach: midline Location: L3-4 Injection technique: single-shot Needle Needle type: Pencan  Needle gauge: 24 G Needle length: 10 cm Assessment Sensory level: T4

## 2018-10-13 NOTE — Addendum Note (Signed)
Addendum  created 10/13/18 1456 by Lenox AhrAdeloye, Zen Felling A, CRNA   Clinical Note Signed

## 2018-10-13 NOTE — Op Note (Signed)
PreOp Diagnosis: Intrauterine pregnancy @ 5360w1d Prior C-section, desires repeat PostOp Diagnosis: same Procedure: Repeat C-section Surgeon: Dr. Myna HidalgoJennifer Konnor Vondrasek Assistant: Webb SilversmithHeather Krietemeyer, RNFA Anesthesia: spinal Complications: none EBL: 234cc UOP: 50cc Fluids: 2500cc  Findings: Female infant from vertex presentation, loose nuchal cord, normal uterus, tubes and ovaries bilaterally  PROCEDURE:  Informed consent was obtained from the patient with risks, benefits, complications, treatment options, and expected outcomes discussed with the patient.  The patient concurred with the proposed plan, giving informed consent with form signed.   The patient was taken to Operating Room, and identified with the procedure verified as C-Section Delivery with Time Out. With induction of anesthesia, the patient was prepped and draped in the usual sterile fashion. A Pfannenstiel incision was made and carried down through the subcutaneous tissue to the fascia. The fascia was incised in the midline and extended transversely. The superior aspect of the fascial incision was grasped with Kochers elevated and the underlying muscle dissected off. The inferior aspect of the facial incision was in similar fashion, grasped elevated and rectus muscles dissected off. The peritoneum was identified and entered bluntly.  Peritoneal incision was extended laterally.   The bladder was noted to be attached to the mid-portion of the uterus.  Using sharp and blunt dissection the bladder adhesions were taken down.  An alexis retractor was placed.  The utero-vesical peritoneal reflection was identified and incised transversely with the [facility] scissors, the incision extended laterally, the bladder flap created digitally. A low transverse uterine incision was made and the infants head delivered atraumatically. After the umbilical cord was clamped and cut cord blood was obtained for evaluation.   The placenta was removed intact and appeared  normal. The uterine outline, tubes and ovaries appeared normal. The uterine incision was closed with running locked sutures of 0 Vicryl and a second layer of the same stitch was used in an imbricating fashion.  Excellent hemostasis was obtained.  The pericolic gutters were then cleared of all clots and debris. Interceed was placed.  The peritoneum was closed in a running fashion. The fascia was then reapproximated with running sutures of 0 Vicryl. The subcutaneous tissue was reapproximated with 2-0 plain gut suture.  The skin was closed with 4-0 vicryl in a subcuticular fashion.  Instrument, sponge, and needle counts were correct prior the abdominal closure and at the conclusion of the case. The patient was taken to recovery in stable condition.  Myna HidalgoJennifer Torrie Lafavor, DO [phone] (cell) [phone] (office)

## 2018-10-13 NOTE — Anesthesia Postprocedure Evaluation (Signed)
Anesthesia Post Note  Patient: Nicole Collins  Procedure(s) Performed: REPEAT CESAREAN SECTION (N/A )     Patient location during evaluation: PACU Anesthesia Type: Spinal Level of consciousness: oriented and awake and alert Pain management: pain level controlled Vital Signs Assessment: post-procedure vital signs reviewed and stable Respiratory status: spontaneous breathing and respiratory function stable Cardiovascular status: blood pressure returned to baseline and stable Postop Assessment: no headache, no backache and no apparent nausea or vomiting Anesthetic complications: no    Last Vitals:  Vitals:   10/13/18 0900 10/13/18 0915  BP: 128/76 127/79  Pulse: 90 76  Resp: 19 (!) 21  Temp: 36.9 C   SpO2: 95% 97%    Last Pain:  Vitals:   10/13/18 0915  TempSrc:   PainSc: 0-No pain   Pain Goal:                 Lowella CurbWarren Ray Ailene Royal

## 2018-10-13 NOTE — Lactation Note (Signed)
This note was copied from a [name] chart. Lactation Consultation Note  Patient Name: Nicole Collins ZOXWR'UToday'Collins Date: 10/13/2018 Reason for consult: Initial assessment;Term Breastfeeding consultation services and support information given and reviewed.  Mom breastfed her first baby for 13 months and states it was a good experience.  Newborn is 5 hours old and fed for 45 minutes initially.  Baby is currently skin to skin in football hold but sleepy and not showing interest in feeding.  Mom can easily hand express colostrum into [name] mouth.  Instructed to feed with cues.  First 24 hour behavior discussed.  Encouraged to call for assist/concerns prn.  Maternal Data Has patient been taught Hand Expression?: Yes Does the patient have breastfeeding experience prior to this delivery?: Yes  Feeding    LATCH Score                   Interventions    Lactation Tools Discussed/Used     Consult Status Consult Status: Follow-up Date: 10/14/18 Follow-up type: In-patient    Huston FoleyMOULDEN, Nicole Collins 10/13/2018, 1:23 PM

## 2018-10-13 NOTE — Anesthesia Postprocedure Evaluation (Signed)
Anesthesia Post Note  Patient: Nicole Collins  Procedure(s) Performed: REPEAT CESAREAN SECTION (N/A )     Patient location during evaluation: Mother Baby Anesthesia Type: Spinal Level of consciousness: oriented and awake and alert Pain management: pain level controlled Vital Signs Assessment: post-procedure vital signs reviewed and stable Respiratory status: spontaneous breathing and respiratory function stable Cardiovascular status: blood pressure returned to baseline and stable Postop Assessment: no headache, no backache, no apparent nausea or vomiting, able to ambulate and adequate PO intake Anesthetic complications: no    Last Vitals:  Vitals:   10/13/18 1235 10/13/18 1417  BP: 119/67   Pulse: 78   Resp: 16 16  Temp: 37.1 C   SpO2: 97% 97%    Last Pain:  Vitals:   10/13/18 1235  TempSrc: Axillary  PainSc: 1    Pain Goal: Patients Stated Pain Goal: 3 (10/13/18 1235)               Blythe Stanfordavid Adedayo Daylyn Christine

## 2018-10-13 NOTE — Anesthesia Preprocedure Evaluation (Signed)
Anesthesia Evaluation  Patient identified by MRN, date of birth, ID band Patient awake    Reviewed: Allergy & Precautions, NPO status , Patient's Chart, lab work & pertinent test results  History of Anesthesia Complications Negative for: history of anesthetic complications  Airway Mallampati: II  TM Distance: >3 FB Neck ROM: Full    Dental no notable dental hx. (+) Dental Advisory Given   Pulmonary neg pulmonary ROS,    Pulmonary exam normal breath sounds clear to auscultation       Cardiovascular negative cardio ROS Normal cardiovascular exam Rhythm:Regular Rate:Normal     Neuro/Psych negative neurological ROS  negative psych ROS   GI/Hepatic negative GI ROS, Neg liver ROS,   Endo/Other  obesity  Renal/GU negative Renal ROS  negative genitourinary   Musculoskeletal negative musculoskeletal ROS (+)   Abdominal (+) + obese,   Peds negative pediatric ROS (+)  Hematology negative hematology ROS (+)   Anesthesia Other Findings   Reproductive/Obstetrics (+) Pregnancy                             Anesthesia Physical  Anesthesia Plan  ASA: II  Anesthesia Plan: Spinal   Post-op Pain Management:    Induction:   PONV Risk Score and Plan: 2 and Treatment may vary due to age or medical condition  Airway Management Planned: Natural Airway  Additional Equipment:   Intra-op Plan:   Post-operative Plan:   Informed Consent: I have reviewed the patients History and Physical, chart, labs and discussed the procedure including the risks, benefits and alternatives for the proposed anesthesia with the patient or authorized representative who has indicated his/her understanding and acceptance.   Dental advisory given  Plan Discussed with: CRNA and Surgeon  Anesthesia Plan Comments:         Anesthesia Quick Evaluation

## 2018-10-13 NOTE — Interval H&P Note (Signed)
History and Physical Interval Note:  10/13/2018 6:53 AM  Nicole AduSamantha Fariss  has presented today for surgery, with the diagnosis of Z98.891 H/O C-Section  The various methods of treatment have been discussed with the patient and family. After consideration of risks, benefits and other options for treatment, the patient has consented to  Procedure(s) with comments: REPEAT CESAREAN SECTION (N/A) - Heather, RNFA as a surgical intervention .  The patient's history has been reviewed, patient examined, no change in status, stable for surgery.  I have reviewed the patient's chart and labs.  Questions were answered to the patient's satisfaction.     Sharon SellerJennifer M Jasslyn Finkel

## 2018-10-14 LAB — CBC
HCT: 30.5 % — ABNORMAL LOW (ref 36.0–46.0)
Hemoglobin: 9.7 g/dL — ABNORMAL LOW (ref 12.0–15.0)
MCH: 29.6 pg (ref 26.0–34.0)
MCHC: 31.8 g/dL (ref 30.0–36.0)
MCV: 93 fL (ref 80.0–100.0)
Platelets: 173 10*3/uL (ref 150–400)
RBC: 3.28 MIL/uL — ABNORMAL LOW (ref 3.87–5.11)
RDW: 15.1 % (ref 11.5–15.5)
WBC: 9 10*3/uL (ref 4.0–10.5)
nRBC: 0 % (ref 0.0–0.2)

## 2018-10-14 LAB — BIRTH TISSUE RECOVERY COLLECTION (PLACENTA DONATION)

## 2018-10-14 NOTE — Plan of Care (Signed)
Progressing appropriately. Encouraged to call for assistance as needed, and for LATCH assessment.  

## 2018-10-14 NOTE — Progress Notes (Signed)
Postpartum Note Day # 1  S:  Patient resting comfortable in bed.  Pain controlled.  Tolerating general diet. No flatus, no BM.  Lochia appropriate.  Ambulating without difficulty.  She denies n/v/f/c, SOB, or CP.  Foley removed this am, pt voided on her own.  Pt plans on breastfeeding.  O: Temp:  [97.6 F (36.4 C)-100.2 F (37.9 C)] 98.3 F (36.8 C) (12/24 0400) Pulse Rate:  [65-92] 67 (12/24 0400) Resp:  [14-21] 18 (12/24 0400) BP: (96-128)/(57-79) 96/57 (12/24 0400) SpO2:  [95 %-98 %] 97 % (12/23 1557)   Gen: A&Ox3, NAD CV: RRR, no MRG Resp: CTAB Abdomen: soft, NT, ND BS quiet Uterus: firm, non-tender, below umbilicus Incision: c/d/i, bandage on Ext: 1+ edema, no calf tenderness bilaterally, SCDs in place  Labs:  CBC Latest Ref Rng & Units 10/14/2018 10/10/2018 09/29/2018  WBC 4.0 - 10.5 K/uL 9.0 7.2 8.0  Hemoglobin 12.0 - 15.0 g/dL 4.0(N9.7(L) 02.712.1 25.312.1  Hematocrit 36.0 - 46.0 % 30.5(L) 38.1 38.0  Platelets 150 - 400 K/uL 173 203 222   A/P: Pt is a 32 y.o. [id number] s/p repeat C-section, POD#1  - Pain well controlled -GU: UOP is adequate -GI: Tolerating general diet -Activity: encouraged sitting up to chair and ambulation as tolerated -Prophylaxis: early ambulation, SCDs while in bed -Labs: stable as above, continue PNV with iron -Baby boy circ completed  DISPO: Continue with routine postop care  Myna HidalgoJennifer Abdulaziz Toman, DO [phone] (cell) [phone] (office)

## 2018-10-15 MED ORDER — OXYCODONE HCL 5 MG PO TABS: 5.0000 mg | ORAL_TABLET | Freq: Four times a day (QID) | ORAL | 0 refills | Status: DC | PRN

## 2018-10-15 MED ORDER — IBUPROFEN 600 MG PO TABS: 600.0000 mg | ORAL_TABLET | Freq: Four times a day (QID) | ORAL | 0 refills | Status: AC | PRN

## 2018-10-15 MED ORDER — FERROUS SULFATE 325 (65 FE) MG PO TABS: 325.0000 mg | ORAL_TABLET | Freq: Every day | ORAL | 0 refills | Status: DC

## 2018-10-15 NOTE — Lactation Note (Signed)
This note was copied from a baby's chart. Lactation Consultation Note  Patient Name: Nicole Collins ZOXWR'UToday's Date: 10/15/2018 Reason for consult: Term;Follow-up assessment;Infant weight loss(P2 , 8 % weight loss, per mom milk is in )  Baby is 3252 hours old  LC reviewed doc flow sheets with mom / up to date.  Per mom feels breast are fuller and milk is coming in both breast.  Mom denies soreness - sore nipple and engorgement prevention and tx reviewed.  Per mom has hand pump and a DEBP from her 1st baby. Mom and dad expressed  Excitement this baby  is feeding so much better than their 1st baby at this age.  LC discussed with mom since this is her 2nd baby and she was long term BF with her 1st ,  Her volume can be greater and let- down can be quicker. If breast are really full to start  May need to hand express fullness or pre - pump with hand pump so the areola is compressible  Like a sandwich. Of baby only feeds 1st breast and doesn't feed 2nd breast to release down to  Comfort to prevent engorgement and protect milk supply.  Since the baby has 8 % weight loss - when the baby isn't cluster feeding add some pot pumping  2-3 times both breast , can feed back to baby with a spoon.  Nutritive vs non - nutritive feeding patterns and the importance of watching the baby for hanging  Out latched.  Mother informed of post-discharge support and given phone number to the lactation department, including services for phone call assistance; out-patient appointments; and breastfeeding support group. List of other breastfeeding resources in the community given in the handout. Encouraged mother to call for problems or concerns related to breastfeeding.     Maternal Data Has patient been taught Hand Expression?: Yes  Feeding    LATCH Score                   Interventions Interventions: Breast feeding basics reviewed  Lactation Tools Discussed/Used WIC Program: No Pump Review: Milk  Storage;Setup, frequency, and cleaning Date initiated:: 10/15/18   Consult Status Consult Status: Complete Date: 10/15/18    Nicole Collins 10/15/2018, 12:14 PM

## 2018-10-15 NOTE — Discharge Instructions (Signed)
Cesarean Delivery, Care After °This sheet gives you information about how to care for yourself after your procedure. Your health care provider may also give you more specific instructions. If you have problems or questions, contact your health care provider. °What can I expect after the procedure? °After the procedure, it is common to have: °· A small amount of blood or clear fluid coming from the incision. °· Some redness, swelling, and pain in your incision area. °· Some abdominal pain and soreness. °· Vaginal bleeding (lochia). Even though you did not have a vaginal delivery, you will still have vaginal bleeding and discharge. °· Pelvic cramps. °· Fatigue. °You may have pain, swelling, and discomfort in the tissue between your vagina and your anus (perineum) if: °· Your C-section was unplanned, and you were allowed to labor and push. °· An incision was made in the area (episiotomy) or the tissue tore during attempted vaginal delivery. °Follow these instructions at home: °Incision care ° °· Follow instructions from your health care provider about how to take care of your incision. Make sure you: °? Wash your hands with soap and water before you change your bandage (dressing). If soap and water are not available, use hand sanitizer. °? If you have a dressing, change it or remove it as told by your health care provider. °? Leave stitches (sutures), skin staples, skin glue, or adhesive strips in place. These skin closures may need to stay in place for 2 weeks or longer. If adhesive strip edges start to loosen and curl up, you may trim the loose edges. Do not remove adhesive strips completely unless your health care provider tells you to do that. °· Check your incision area every day for signs of infection. Check for: °? More redness, swelling, or pain. °? More fluid or blood. °? Warmth. °? Pus or a bad smell. °· Do not take baths, swim, or use a hot tub until your health care provider says it's okay. Ask your health  care provider if you can take showers. °· When you cough or sneeze, hug a pillow. This helps with pain and decreases the chance of your incision opening up (dehiscing). Do this until your incision heals. °Medicines °· Take over-the-counter and prescription medicines only as told by your health care provider. °· If you were prescribed an antibiotic medicine, take it as told by your health care provider. Do not stop taking the antibiotic even if you start to feel better. °· Do not drive or use heavy machinery while taking prescription pain medicine. °Lifestyle °· Do not drink alcohol. This is especially important if you are breastfeeding or taking pain medicine. °· Do not use any products that contain nicotine or tobacco, such as cigarettes, e-cigarettes, and chewing tobacco. If you need help quitting, ask your health care provider. °Eating and drinking °· Drink at least 8 eight-ounce glasses of water every day unless told not to by your health care provider. If you breastfeed, you may need to drink even more water. °· Eat high-fiber foods every day. These foods may help prevent or relieve constipation. High-fiber foods include: °? Whole grain cereals and breads. °? Brickhouse rice. °? Beans. °? Fresh fruits and vegetables. °Activity ° °· If possible, have someone help you care for your baby and help with household activities for at least a few days after you leave the hospital. °· Return to your normal activities as told by your health care provider. Ask your health care provider what activities are safe for   you. °· Rest as much as possible. Try to rest or take a nap while your baby is sleeping. °· Do not lift anything that is heavier than 10 lbs (4.5 kg), or the limit that you were told, until your health care provider says that it is safe. °· Talk with your health care provider about when you can engage in sexual activity. This may depend on your: °? Risk of infection. °? How fast you heal. °? Comfort and desire to  engage in sexual activity. °General instructions °· Do not use tampons or douches until your health care provider approves. °· Wear loose, comfortable clothing and a supportive and well-fitting bra. °· Keep your perineum clean and dry. Wipe from front to back when you use the toilet. °· If you pass a blood clot, save it and call your health care provider to discuss. Do not flush blood clots down the toilet before you get instructions from your health care provider. °· Keep all follow-up visits for you and your baby as told by your health care provider. This is important. °Contact a health care provider if: °· You have: °? A fever. °? Bad-smelling vaginal discharge. °? Pus or a bad smell coming from your incision. °? Difficulty or pain when urinating. °? A sudden increase or decrease in the frequency of your bowel movements. °? More redness, swelling, or pain around your incision. °? More fluid or blood coming from your incision. °? A rash. °? Nausea. °? Little or no interest in activities you used to enjoy. °? Questions about caring for yourself or your baby. °· Your incision feels warm to the touch. °· Your breasts turn red or become painful or hard. °· You feel unusually sad or worried. °· You vomit. °· You pass a blood clot from your vagina. °· You urinate more than usual. °· You are dizzy or light-headed. °Get help right away if: °· You have: °? Pain that does not go away or get better with medicine. °? Chest pain. °? Difficulty breathing. °? Blurred vision or spots in your vision. °? Thoughts about hurting yourself or your baby. °? New pain in your abdomen or in one of your legs. °? A severe headache. °· You faint. °· You bleed from your vagina so much that you fill more than one sanitary pad in one hour. Bleeding should not be heavier than your heaviest period. °Summary °· After the procedure, it is common to have pain at your incision site, abdominal cramping, and slight bleeding from your vagina. °· Check  your incision area every day for signs of infection. °· Tell your health care provider about any unusual symptoms. °· Keep all follow-up visits for you and your baby as told by your health care provider. °This information is not intended to replace advice given to you by your health care provider. Make sure you discuss any questions you have with your health care provider. °Document Released: 06/30/2002 Document Revised: 04/16/2018 Document Reviewed: 04/16/2018 °Elsevier Interactive Patient Education © 2019 [employer] ° ° °Postpartum Baby Blues °The postpartum period begins right after the birth of a baby. During this time, there is often a lot of joy and excitement. It is also a time of many changes in the life of the parents. No matter how many times a mother gives birth, each child brings new challenges to the family, including different ways of relating to one another. °It is common to have feelings of excitement along with confusing changes in moods,   emotions, and thoughts. You may feel happy one minute and sad or stressed the next. These feelings of sadness usually happen in the period right after you have your baby, and they go away within a week or two. This is called the "baby blues." °What are the causes? °There is no known cause of baby blues. It is likely caused by a combination of factors. However, changes in hormone levels after childbirth are believed to trigger some of the symptoms. °Other factors that can play a role in these mood changes include: °· Lack of sleep. °· Stressful life events, such as poverty, caring for a loved one, or death of a loved one. °· Genetics. °What are the signs or symptoms? °Symptoms of this condition include: °· Brief changes in mood, such as going from extreme happiness to sadness. °· Decreased concentration. °· Difficulty sleeping. °· Crying spells and tearfulness. °· Loss of appetite. °· Irritability. °· Anxiety. °If the symptoms of baby blues last for more than 2  weeks or become more severe, you may have postpartum depression. °How is this diagnosed? °This condition is diagnosed based on an evaluation of your symptoms. There are no medical or lab tests that lead to a diagnosis, but there are various questionnaires that a health care provider may use to identify women with the baby blues or postpartum depression. °How is this treated? °Treatment is not needed for this condition. The baby blues usually go away on their own in 1-2 weeks. Social support is often all that is needed. You will be encouraged to get adequate sleep and rest. °Follow these instructions at home: °Lifestyle ° °  ° °· Get as much rest as you can. Take a nap when the baby sleeps. °· Exercise regularly as told by your health care provider. Some women find yoga and walking to be helpful. °· Eat a balanced and nourishing diet. This includes plenty of fruits and vegetables, whole grains, and lean proteins. °· Do little things that you enjoy. Have a cup of tea, take a bubble bath, read your favorite magazine, or listen to your favorite music. °· Avoid alcohol. °· Ask for help with household chores, cooking, grocery shopping, or running errands. Do not try to do everything yourself. Consider hiring a postpartum doula to help. This is a professional who specializes in providing support to new mothers. °· Try not to make any major life changes during pregnancy or right after giving birth. This can add stress. °General instructions °· Talk to people close to you about how you are feeling. Get support from your partner, family members, friends, or other new moms. You may want to join a support group. °· Find ways to cope with stress. This may include: °? Writing your thoughts and feelings in a journal. °? Spending time outside. °? Spending time with people who make you laugh. °· Try to stay positive in how you think. Think about the things you are grateful for. °· Take over-the-counter and prescription medicines  only as told by your health care provider. °· Let your health care provider know if you have any concerns. °· Keep all postpartum visits as told by your health care provider. This is important. °Contact a health care provider if: °· Your baby blues do not go away after 2 weeks. °Get help right away if: °· You have thoughts of taking your own life (suicidal thoughts). °· You think you may harm the baby or other people. °· You see or hear things that are   not there (hallucinations). °Summary °· After giving birth, you may feel happy one minute and sad or stressed the next. Feelings of sadness that happen right after the baby is born and go away after a week or two are called the "baby blues." °· You can manage the baby blues by getting enough rest, eating a healthy diet, exercising, spending time with supportive people, and finding ways to cope with stress. °· If feelings of sadness and stress last longer than 2 weeks or get in the way of caring for your baby, talk to your health care provider. This may mean you have postpartum depression. °This information is not intended to replace advice given to you by your health care provider. Make sure you discuss any questions you have with your health care provider. °Document Released: 07/12/2004 Document Revised: 12/04/2016 Document Reviewed: 12/04/2016 °Elsevier Interactive Patient Education © 2019 [employer] ° ° °

## 2018-10-15 NOTE — Discharge Summary (Signed)
OB Discharge Summary     Patient Name: Nicole Collins DOB: 12/22/1985 MRN: [id number]  Date of admission: 10/13/2018 Delivering MD: Myna HidalgoZAN, JENNIFER   Date of discharge: 10/15/2018  Admitting diagnosis: Z98.891 H O C-Section Intrauterine pregnancy: 2832w1d     Secondary diagnosis:  Active Problems:   Intrauterine pregnancy   Normal intrauterine pregnancy in third trimester      Discharge diagnosis: Term Pregnancy Delivered                                                                                                Post partum procedures:n/a  Augmentation: n/a  Complications: None  Hospital course:  Sceduled C/S   32 y.o. yo G2P2002 at 1632w1d was admitted to the hospital 10/13/2018 for scheduled cesarean section with the following indication:Elective Repeat.  Membrane Rupture Time/Date: 7:47 AM ,10/13/2018   Patient delivered a Viable infant.10/13/2018  Details of operation can be found in separate operative note.  Pateint had an uncomplicated postpartum course.  She is ambulating, tolerating a regular diet, passing flatus, and urinating well. Patient is discharged home in stable condition on  10/15/18         Physical exam  Vitals:   10/14/18 0924 10/14/18 1433 10/14/18 2219 10/15/18 0538  BP: (!) 110/55 113/63 123/76 118/83  Pulse: 76 76 83 74  Resp: 16 18 20    Temp:  98 F (36.7 C) 98 F (36.7 C)   TempSrc:  Oral Oral   SpO2: 99% 97% 97%   Weight:      Height:       General: alert, cooperative and no distress Lochia: appropriate Uterine Fundus: firm Incision: Healing well with no significant drainage, No significant erythema, Dressing is clean, dry, and intact DVT Evaluation: No evidence of DVT seen on physical exam. Negative Homan's sign. No cords or calf tenderness. No significant calf/ankle edema. Labs: Lab Results  Component Value Date   WBC 9.0 10/14/2018   HGB 9.7 (L) 10/14/2018   HCT 30.5 (L) 10/14/2018   MCV 93.0 10/14/2018   PLT 173 10/14/2018    CMP Latest Ref Rng & Units 09/29/2018  Glucose 70 - 99 mg/dL 83  BUN 6 - 20 mg/dL 7  Creatinine 2.720.44 - 5.361.00 mg/dL 6.440.55  Sodium 034135 - 742145 mmol/L 135  Potassium 3.5 - 5.1 mmol/L 3.8  Chloride 98 - 111 mmol/L 105  CO2 22 - 32 mmol/L 21(L)  Calcium 8.9 - 10.3 mg/dL 5.9(D8.6(L)  Total Protein 6.5 - 8.1 g/dL 6.7  Total Bilirubin 0.3 - 1.2 mg/dL 0.6  Alkaline Phos 38 - 126 U/L 178(H)  AST 15 - 41 U/L 19  ALT 0 - 44 U/L 13    Discharge instruction: per After Visit Summary and "Baby and Me Booklet".  After visit meds:  Allergies as of 10/15/2018      Reactions   Amoxicillin    REACTION: Hives and swelling   Penicillins Rash   Has patient had a PCN reaction causing immediate rash, facial/tongue/throat swelling, SOB or lightheadedness with hypotension: Yes Has patient had a PCN reaction causing severe rash involving mucus  membranes or skin necrosis: Yes Has patient had a PCN reaction that required hospitalization no Has patient had a PCN reaction occurring within the last 10 years: No If all of the above answers are "NO", then may proceed with Cephalosporin use.      Medication List    TAKE these medications   acetaminophen 325 MG tablet Commonly known as:  TYLENOL Take 325 mg by mouth every 6 (six) hours as needed for headache.   calcium carbonate 500 MG chewable tablet Commonly known as:  TUMS - dosed in mg elemental calcium Chew 2 tablets by mouth 3 (three) times daily as needed for indigestion or heartburn.   ferrous sulfate 325 (65 FE) MG tablet Take 1 tablet (325 mg total) by mouth daily.   ibuprofen 600 MG tablet Commonly known as:  ADVIL,MOTRIN Take 1 tablet (600 mg total) by mouth every 6 (six) hours as needed for moderate pain.   oxyCODONE 5 MG immediate release tablet Commonly known as:  Oxy IR/ROXICODONE Take 1 tablet (5 mg total) by mouth every 6 (six) hours as needed for up to 7 days for severe pain.   prenatal multivitamin Tabs tablet Take 1 tablet by mouth at  bedtime.       Diet: routine diet, mild asymptomatic anemia patient started on PO Iron QD.   Activity: Advance as tolerated. Pelvic rest for 6 weeks.   Outpatient follow up:6 weeks Follow up Appt:No future appointments. Follow up Visit:No follow-ups on file.  Postpartum contraception: Undecided  Newborn Data: Live born female  Birth Weight: 7 lb 7.9 oz (3400 g) APGAR: 8, 9  Newborn Delivery   Birth date/time:  10/13/2018 07:48:00 Delivery type:  C-Section, Low Transverse Trial of labor:  No C-section categorization:  Repeat     Baby Feeding: Breast Disposition:home with mother   10/15/2018 Nicole Collins, CNM

## 2018-10-16 DIAGNOSIS — Z00110 Health examination for newborn under 8 days old: Secondary | ICD-10-CM | POA: Diagnosis not present

## 2018-10-22 ENCOUNTER — Inpatient Hospital Stay (HOSPITAL_COMMUNITY)
Admission: AD | Admit: 2018-10-22 | Discharge: 2018-10-23 | Disposition: A | Discharge status: Home / Self Care | Payer: BLUE CROSS/BLUE SHIELD | Source: Ambulatory Visit | Attending: Obstetrics and Gynecology | Admitting: Obstetrics and Gynecology

## 2018-10-22 ENCOUNTER — Other Ambulatory Visit: Payer: Self-pay

## 2018-10-22 ENCOUNTER — Encounter (HOSPITAL_COMMUNITY): Payer: Self-pay

## 2018-10-22 DIAGNOSIS — R52 Pain, unspecified: Secondary | ICD-10-CM | POA: Diagnosis not present

## 2018-10-22 DIAGNOSIS — O9089 Other complications of the puerperium, not elsewhere classified: Secondary | ICD-10-CM | POA: Insufficient documentation

## 2018-10-22 DIAGNOSIS — E860 Dehydration: Secondary | ICD-10-CM | POA: Diagnosis not present

## 2018-10-22 DIAGNOSIS — Z880 Allergy status to penicillin: Secondary | ICD-10-CM | POA: Diagnosis not present

## 2018-10-22 DIAGNOSIS — R1084 Generalized abdominal pain: Secondary | ICD-10-CM | POA: Diagnosis not present

## 2018-10-22 DIAGNOSIS — R55 Syncope and collapse: Secondary | ICD-10-CM

## 2018-10-22 DIAGNOSIS — W182XXA Fall in (into) shower or empty bathtub, initial encounter: Secondary | ICD-10-CM | POA: Insufficient documentation

## 2018-10-22 DIAGNOSIS — R42 Dizziness and giddiness: Secondary | ICD-10-CM | POA: Diagnosis not present

## 2018-10-22 LAB — COMPREHENSIVE METABOLIC PANEL
ALT: 22 U/L (ref 0–44)
AST: 21 U/L (ref 15–41)
Albumin: 3.6 g/dL (ref 3.5–5.0)
Alkaline Phosphatase: 126 U/L (ref 38–126)
Anion gap: 9 (ref 5–15)
BUN: 16 mg/dL (ref 6–20)
CO2: 24 mmol/L (ref 22–32)
Calcium: 8.9 mg/dL (ref 8.9–10.3)
Chloride: 106 mmol/L (ref 98–111)
Creatinine, Ser: 0.76 mg/dL (ref 0.44–1.00)
GFR calc Af Amer: >60 mL/min (ref >60)
GFR calc non Af Amer: >60 mL/min (ref >60)
Glucose, Bld: 113 mg/dL — ABNORMAL HIGH (ref 70–99)
Potassium: 3.9 mmol/L (ref 3.5–5.1)
Sodium: 139 mmol/L (ref 135–145)
Total Bilirubin: 0.6 mg/dL (ref 0.3–1.2)
Total Protein: 7.2 g/dL (ref 6.5–8.1)

## 2018-10-22 LAB — URINALYSIS, ROUTINE W REFLEX MICROSCOPIC
Bilirubin Urine: NEGATIVE
Glucose, UA: NEGATIVE mg/dL
Ketones, ur: NEGATIVE mg/dL
Nitrite: NEGATIVE
Protein, ur: 30 mg/dL — AB
Specific Gravity, Urine: 1.024 (ref 1.005–1.030)
pH: 6 (ref 5.0–8.0)

## 2018-10-22 LAB — CBC WITH DIFFERENTIAL/PLATELET
Basophils Absolute: 0 10*3/uL (ref 0.0–0.1)
Basophils Relative: 0 %
Eosinophils Absolute: 0.2 10*3/uL (ref 0.0–0.5)
Eosinophils Relative: 2 %
HCT: 40 % (ref 36.0–46.0)
Hemoglobin: 12.4 g/dL (ref 12.0–15.0)
Lymphocytes Relative: 19 %
Lymphs Abs: 1.8 10*3/uL (ref 0.7–4.0)
MCH: 28.8 pg (ref 26.0–34.0)
MCHC: 31 g/dL (ref 30.0–36.0)
MCV: 93 fL (ref 80.0–100.0)
Monocytes Absolute: 0.4 10*3/uL (ref 0.1–1.0)
Monocytes Relative: 4 %
Neutro Abs: 7.1 10*3/uL (ref 1.7–7.7)
Neutrophils Relative %: 75 %
Platelets: 397 10*3/uL (ref 150–400)
RBC: 4.3 MIL/uL (ref 3.87–5.11)
RDW: 14.2 % (ref 11.5–15.5)
WBC: 9.4 10*3/uL (ref 4.0–10.5)
nRBC: 0 % (ref 0.0–0.2)

## 2018-10-22 MED ORDER — LACTATED RINGERS IV BOLUS
1000.0000 mL | Freq: Once | INTRAVENOUS | Status: AC
  Administered 2018-10-22: 1000 mL via INTRAVENOUS

## 2018-10-22 NOTE — MAU Provider Note (Signed)
History     CSN: [id number]  Arrival date and time: 10/22/18 2142   First Provider Initiated Contact with Patient 10/22/18 2225      Chief Complaint  Patient presents with  . Fall   Nicole Collins is a 33 y.o. G2P2 s/p repeat C/S on 12/23 who presents to MAU via EMS due to syncopal episode. She reports eating dinner around 1930 and getting in the shower after, while in shower she reports becoming dizzy and passing out. Patient husband reports hearing her fall in the shower and calling out to her with no response. Syncopal episode occurred for less than a minute per husband, by the time he got to the shower she was awake. She does not believe she hit her head and denies having a HA.  She reports after episode she went to breastfeed newborn and started getting dizzy during. Got up to urinate and felt lightheaded, dizzy and "unable to catch breath, started hyperventilating" on the toilet then passed a large clot. She denies continuing to bleed after passing clot. She denies complications during her pregnancy. Called Dr Richardson Dopp prior to husband calling EMS.    OB History    Gravida  2   Para  2   Term  2   Preterm  0   AB  0   Living  2     SAB  0   TAB  0   Ectopic  0   Multiple  0   Live Births  2           Past Medical History:  Diagnosis Date  . Medical history non-contributory     Past Surgical History:  Procedure Laterality Date  . CESAREAN SECTION N/A 10/14/2015   Procedure: CESAREAN SECTION;  Surgeon: Myna Hidalgo, DO;  Location: [facility];  Service: Obstetrics;  Laterality: N/A;  . CESAREAN SECTION N/A 10/13/2018   Procedure: REPEAT CESAREAN SECTION;  Surgeon: Myna Hidalgo, DO;  Location: [facility];  Service: Obstetrics;  Laterality: N/A;  Heather, RNFA  . TONSILLECTOMY      Family History  Problem Relation Age of Onset  . Diabetes Father   . Diabetes Maternal Aunt   . Diabetes Paternal Aunt   . Diabetes Maternal Grandmother   . Diabetes  Paternal Grandfather     Social History   Tobacco Use  . Smoking status: Never Smoker  . Smokeless tobacco: Never Used  Substance Use Topics  . Alcohol use: No  . Drug use: No    Allergies:  Allergies  Allergen Reactions  . Amoxicillin     REACTION: Hives and swelling  . Penicillins Rash    Has patient had a PCN reaction causing immediate rash, facial/tongue/throat swelling, SOB or lightheadedness with hypotension: Yes Has patient had a PCN reaction causing severe rash involving mucus membranes or skin necrosis: Yes Has patient had a PCN reaction that required hospitalization no Has patient had a PCN reaction occurring within the last 10 years: No If all of the above answers are "NO", then may proceed with Cephalosporin use.    Medications Prior to Admission  Medication Sig Dispense Refill Last Dose  . acetaminophen (TYLENOL) 325 MG tablet Take 325 mg by mouth every 6 (six) hours as needed for headache.   Past Week at Unknown time  . calcium carbonate (TUMS - DOSED IN MG ELEMENTAL CALCIUM) 500 MG chewable tablet Chew 2 tablets by mouth 3 (three) times daily as needed for indigestion or heartburn.  09/28/2018 at Unknown time  . ferrous sulfate 325 (65 FE) MG tablet Take 1 tablet (325 mg total) by mouth daily. 90 tablet 0   . ibuprofen (ADVIL,MOTRIN) 600 MG tablet Take 1 tablet (600 mg total) by mouth every 6 (six) hours as needed for moderate pain. 30 tablet 0   . oxyCODONE (OXY IR/ROXICODONE) 5 MG immediate release tablet Take 1 tablet (5 mg total) by mouth every 6 (six) hours as needed for up to 7 days for severe pain. 15 tablet 0   . Prenatal Vit-Fe Fumarate-FA (PRENATAL MULTIVITAMIN) TABS tablet Take 1 tablet by mouth at bedtime.    09/30/2018 at Unknown time    Review of Systems  Constitutional: Negative.   Respiratory: Negative for apnea, chest tightness and wheezing.        "hyperventilation"  Cardiovascular: Negative.   Gastrointestinal: Negative.   Genitourinary:  Positive for vaginal bleeding. Negative for vaginal discharge.       Passed one large clot, not currently bleeding   Musculoskeletal: Negative.   Neurological: Positive for dizziness, syncope and light-headedness. Negative for weakness and headaches.   Physical Exam   Patient Vitals for the past 24 hrs:  BP Temp Temp src Pulse Resp SpO2  10/23/18 0043 126/78 - - 84 - -  10/22/18 2149 118/79 98.6 F (37 C) Oral 98 18 100 %   Physical Exam  Nursing note and vitals reviewed. Constitutional: She is oriented to person, place, and time. She appears well-developed and well-nourished. No distress.  Cardiovascular: Normal rate, regular rhythm and normal heart sounds.  Respiratory: Effort normal and breath sounds normal. No respiratory distress. She has no wheezes. She has no rales. She exhibits no tenderness.  GI: Soft. Bowel sounds are normal. She exhibits no distension. There is no abdominal tenderness. There is no rebound.  Incision approximated, healing well, steri strips in place, no erythema or drainage. Fundal height 4 FB below umbilicus   Genitourinary:    No vaginal discharge or bleeding.  No bleeding in the vagina.  Musculoskeletal:        General: No tenderness or edema.  Neurological: She is alert and oriented to person, place, and time. She displays normal reflexes. No cranial nerve deficit. She exhibits normal muscle tone. Coordination normal.  Skin: Skin is warm and dry. There is pallor.  Psychiatric: She has a normal mood and affect. Her behavior is normal. Thought content normal.   MAU Course  Procedures  MDM Orders Placed This Encounter  Procedures  . Culture, OB Urine  . CBC with Differential/Platelet  . Comprehensive metabolic panel  . Urinalysis, Routine w reflex microscopic  . Orthostatic vital signs  . ED EKG   Meds ordered this encounter  Medications  . lactated ringers bolus 1,000 mL   Treatments in MAU included LR bolus. Reassessment after bolus- patient  reports feeling better, denies SOB, dizziness or HA. Palor resolved.   EKG- NSR  Labs results reviewed - WNL   Results for orders placed or performed during the hospital encounter of 10/22/18 (from the past 24 hour(s))  CBC with Differential/Platelet     Status: None   Collection Time: 10/22/18 10:24 PM  Result Value Ref Range   WBC 9.4 4.0 - 10.5 K/uL   RBC 4.30 3.87 - 5.11 MIL/uL   Hemoglobin 12.4 12.0 - 15.0 g/dL   HCT 44.0 10.2 - 72.5 %   MCV 93.0 80.0 - 100.0 fL   MCH 28.8 26.0 - 34.0 pg   MCHC  31.0 30.0 - 36.0 g/dL   RDW 16.114.2 09.611.5 - 04.515.5 %   Platelets 397 150 - 400 K/uL   nRBC 0.0 0.0 - 0.2 %   Neutrophils Relative % 75 %   Neutro Abs 7.1 1.7 - 7.7 K/uL   Lymphocytes Relative 19 %   Lymphs Abs 1.8 0.7 - 4.0 K/uL   Monocytes Relative 4 %   Monocytes Absolute 0.4 0.1 - 1.0 K/uL   Eosinophils Relative 2 %   Eosinophils Absolute 0.2 0.0 - 0.5 K/uL   Basophils Relative 0 %   Basophils Absolute 0.0 0.0 - 0.1 K/uL  Comprehensive metabolic panel     Status: Abnormal   Collection Time: 10/22/18 10:24 PM  Result Value Ref Range   Sodium 139 135 - 145 mmol/L   Potassium 3.9 3.5 - 5.1 mmol/L   Chloride 106 98 - 111 mmol/L   CO2 24 22 - 32 mmol/L   Glucose, Bld 113 (H) 70 - 99 mg/dL   BUN 16 6 - 20 mg/dL   Creatinine, Ser 4.090.76 0.44 - 1.00 mg/dL   Calcium 8.9 8.9 - 81.110.3 mg/dL   Total Protein 7.2 6.5 - 8.1 g/dL   Albumin 3.6 3.5 - 5.0 g/dL   AST 21 15 - 41 U/L   ALT 22 0 - 44 U/L   Alkaline Phosphatase 126 38 - 126 U/L   Total Bilirubin 0.6 0.3 - 1.2 mg/dL   GFR calc non Af Amer >60 >60 mL/min   GFR calc Af Amer >60 >60 mL/min   Anion gap 9 5 - 15  Urinalysis, Routine w reflex microscopic     Status: Abnormal   Collection Time: 10/22/18 11:05 PM  Result Value Ref Range   Color, Urine YELLOW YELLOW   APPearance HAZY (A) CLEAR   Specific Gravity, Urine 1.024 1.005 - 1.030   pH 6.0 5.0 - 8.0   Glucose, UA NEGATIVE NEGATIVE mg/dL   Hgb urine dipstick LARGE (A) NEGATIVE    Bilirubin Urine NEGATIVE NEGATIVE   Ketones, ur NEGATIVE NEGATIVE mg/dL   Protein, ur 30 (A) NEGATIVE mg/dL   Nitrite NEGATIVE NEGATIVE   Leukocytes, UA SMALL (A) NEGATIVE   RBC / HPF 0-5 0 - 5 RBC/hpf   WBC, UA 21-50 0 - 5 WBC/hpf   Bacteria, UA FEW (A) NONE SEEN   Squamous Epithelial / LPF 6-10 0 - 5   Mucus PRESENT    Orthostatic vitals:  123/67 - 81 129/73 - 82  126/80 - 91  116/79 - 101  Educated and discussed dehydration and water/food supply while breastfeeding. Encouraged 8-10 bottles of water per day at least and protein snacks throughout the day. Discussed reasons to return to MAU. Encouraged patient to move appointment up to 10/27/2018 for follow up, patient verbalizes understanding. Pt stable at time of discharge.   Assessment and Plan   1. Postural syncope   2. Dehydration after exertion   3. Postpartum care following cesarean delivery    Discharge home Follow up on 10/27/2018 in office  Return to MAU as needed  Encouraged increase of water consumption and protein in diet   Follow-up Information    [facility], [facility]   Specialty:  Obstetrics and Gynecology Why:  Make appointment to be seen by 10/27/2018 for follow up  Contact information: [address] [place] [id number] [phone]           Sharyon CableVeronica C Kaeden Depaz CNM 10/22/2018, 2:01 AM

## 2018-10-22 NOTE — MAU Note (Signed)
Pt states at 2000 she was getting into the shower when she passed out and woke up in the tub.   10/13/2018 Pt had a c-section.

## 2018-10-23 DIAGNOSIS — R55 Syncope and collapse: Secondary | ICD-10-CM | POA: Diagnosis not present

## 2018-10-23 DIAGNOSIS — E860 Dehydration: Secondary | ICD-10-CM | POA: Diagnosis not present

## 2018-10-24 LAB — CULTURE, OB URINE

## 2018-11-12 DIAGNOSIS — J9801 Acute bronchospasm: Secondary | ICD-10-CM | POA: Diagnosis not present

## 2018-11-12 DIAGNOSIS — R0600 Dyspnea, unspecified: Secondary | ICD-10-CM | POA: Diagnosis not present

## 2018-12-18 ENCOUNTER — Encounter: Payer: Self-pay | Admitting: Allergy

## 2018-12-18 ENCOUNTER — Ambulatory Visit (INDEPENDENT_AMBULATORY_CARE_PROVIDER_SITE_OTHER): Payer: BLUE CROSS/BLUE SHIELD | Admitting: Allergy

## 2018-12-18 VITALS — BP 136/92 | HR 67 | Temp 98.1°F | Resp 18 | Ht 60.0 in | Wt 165.0 lb

## 2018-12-18 DIAGNOSIS — J3089 Other allergic rhinitis: Secondary | ICD-10-CM

## 2018-12-18 DIAGNOSIS — T50905D Adverse effect of unspecified drugs, medicaments and biological substances, subsequent encounter: Secondary | ICD-10-CM

## 2018-12-18 DIAGNOSIS — J302 Other seasonal allergic rhinitis: Secondary | ICD-10-CM | POA: Insufficient documentation

## 2018-12-18 DIAGNOSIS — J4540 Moderate persistent asthma, uncomplicated: Secondary | ICD-10-CM | POA: Diagnosis not present

## 2018-12-18 DIAGNOSIS — T50905A Adverse effect of unspecified drugs, medicaments and biological substances, initial encounter: Secondary | ICD-10-CM | POA: Insufficient documentation

## 2018-12-18 MED ORDER — BUDESONIDE-FORMOTEROL FUMARATE 160-4.5 MCG/ACT IN AERO: 2.0000 | INHALATION_SPRAY | Freq: Two times a day (BID) | RESPIRATORY_TRACT | 5 refills | Status: DC

## 2018-12-18 NOTE — Assessment & Plan Note (Addendum)
Had asthma as a high schooler and main trigger was dog dander. Asymptomatic up until 2 months ago after she had a C-section. Has been having chest tightness, wheezing and shortness of breath episode mainly at home. She does have a dog who sleeps in the bed at times for the last 2 years. There was a concern if her asthma played a part in a most recent syncopal episode in January 2020.   Today's spirometry showed: 76% improvement in FEV1 post bronchodilator treatment and felt clinically improved concerning for untreated asthma.   Unable to skin test today due to recent antihistamine intake and will return next week to identify any potential triggers.   Discussed removing pets from the bedroom area.  . Daily controller medication(s): Start Symbicort 160 2 puffs twice a day and rinse mouth afterwards. . Prior to physical activity: May use albuterol rescue inhaler 2 puffs 5 to 15 minutes prior to strenuous physical activities. Marland Kitchen Rescue medications: May use albuterol rescue inhaler 2 puffs or nebulizer every 4 to 6 hours as needed for shortness of breath, chest tightness, coughing, and wheezing. Monitor frequency of use.

## 2018-12-18 NOTE — Patient Instructions (Signed)
Today's breathing test was not very good.  . Daily controller medication(s): start Symbicort 160 2 puffs twice a day and rinse mouth afterwards. . Prior to physical activity: May use albuterol rescue inhaler 2 puffs 5 to 15 minutes prior to strenuous physical activities. Marland Kitchen Rescue medications: May use albuterol rescue inhaler 2 puffs or nebulizer every 4 to 6 hours as needed for shortness of breath, chest tightness, coughing, and wheezing. Monitor frequency of use.  . Asthma control goals:  o Full participation in all desired activities (may need albuterol before activity) o Albuterol use two times or less a week on average (not counting use with activity) o Cough interfering with sleep two times or less a month o Oral steroids no more than once a year o No hospitalizations  Return for skin testing next week. No antihistamines for 3 days before.

## 2018-12-18 NOTE — Progress Notes (Signed)
New Patient Note  RE: Nicole Collins MRN: [id number] DOB: 05/10/1986 Date of Office Visit: 12/18/2018  Referring provider: Blair Heys, MD Primary care provider: Blair Heys, MD  Chief Complaint: Shortness of Breath  History of Present Illness: I had the pleasure of seeing Nicole Collins for initial evaluation at the [facility] on 12/18/2018. She is a 33 y.o. female, who is referred here by Blair Heys, MD for the evaluation of shortness of breath.   She reports symptoms of chest tightness/heaviness, shortness of breath, wheezing for 2 months. This usually occurs while awake and can happen at rest. This has happened mainly in her home. Current medications include albuterol prn which help. She reports not using aerochamber with inhalers. She tried the following inhalers: Advair Diskus used in high school with good benefit. Apparently she had asthma triggers from her dog. These symptoms resolved after she went away to college. She does have a dog at home now for the past 2 years. She noticed an asthma flare after her husband vacuumed the carpet.   In the last month, frequency of symptoms: few times/week. Frequency of nocturnal symptoms: 0x/month. Frequency of SABA use: few times/week. Interference with physical activity: unsure. Sleep is undisturbed. In the last 12 months, emergency room visits/urgent care visits/doctor office visits or hospitalizations due to respiratory issues: 1. In the last 12 months, oral steroids courses: no Lifetime history of hospitalization for respiratory issues: no. Prior intubations: no. History of pneumonia: no. She was not evaluated by allergist/pulmonologist in the past. Smoking exposure: no. Up to date with flu vaccine: yes.   Patient had C-section on 10/13/2018 with no issues. No history of pulmonary embolism or DVTs. She had an episode of syncope in January 2020.   Assessment and Plan: Mairen is a 33 y.o. female with: Moderate  persistent asthma without complication Had asthma as a high schooler and main trigger was dog dander. Asymptomatic up until 2 months ago after she had a C-section. Has been having chest tightness, wheezing and shortness of breath episode mainly at home. She does have a dog who sleeps in the bed at times for the last 2 years. There was a concern if her asthma played a part in a most recent syncopal episode in January 2020.   Today's spirometry showed: 76% improvement in FEV1 post bronchodilator treatment and felt clinically improved concerning for untreated asthma.   Unable to skin test today due to recent antihistamine intake and will return next week to identify any potential triggers.   Discussed removing pets from the bedroom area.  . Daily controller medication(s): Start Symbicort 160 2 puffs twice a day and rinse mouth afterwards. . Prior to physical activity: May use albuterol rescue inhaler 2 puffs 5 to 15 minutes prior to strenuous physical activities. Marland Kitchen Rescue medications: May use albuterol rescue inhaler 2 puffs or nebulizer every 4 to 6 hours as needed for shortness of breath, chest tightness, coughing, and wheezing. Monitor frequency of use.   Other allergic rhinitis Rhinitis symptoms for the past 20 years mainly during season changes.  Used over-the-counter medications with some benefit.  No recent ENT evaluation.  Unable to skin test today due to recent antihistamine intake. Return next week for skin testing.   Will make additional recommendations based on skin test results.  Drug reaction Broke out in hives as a child after amoxicillin.  Continue to avoid. Consider testing in future.   Return in about 1 week (around 12/25/2018) for Skin  testing.  Meds ordered this encounter  Medications  . budesonide-formoterol (SYMBICORT) 160-4.5 MCG/ACT inhaler    Sig: Inhale 2 puffs into the lungs 2 (two) times daily.    Dispense:  1 Inhaler    Refill:  5   Other allergy  screening: Rhino conjunctivitis: yes  She reports symptoms of sinusitis, sneezing, coughing, itchy nose. Symptoms have been going on for 20+ years. The symptoms are present during the weather changes. Anosmia: no. Headache: no. She has used tylenol sinus and nettipot with some improvement in symptoms. Sinus infections: yes. Previous work up includes: none. Previous ENT evaluation: yes but not recently. No previous sinus surgery.  Food allergy: no Medication allergy: yes  Amoxicillin - hives as a child Hymenoptera allergy: no Urticaria: no Eczema:yes slightly History of recurrent infections suggestive of immunodeficency: no  Diagnostics: Spirometry:  Tracings reviewed. Her effort: Good reproducible efforts. FVC: 1.74L FEV1: 1.44L, 52% predicted FEV1/FVC ratio: 83% Interpretation: 76% improvement in FEV1 post bronchodilator treatment and felt clinically improved.  Please see scanned spirometry results for details.  Skin Testing: Deferred due to recent antihistamines use.   Past Medical History: Patient Active Problem List   Diagnosis Date Noted  . Moderate persistent asthma without complication 12/18/2018  . Other allergic rhinitis 12/18/2018  . Drug reaction 12/18/2018  . Normal intrauterine pregnancy in third trimester 10/13/2018  . Intrauterine pregnancy 10/11/2018  . Intrauterine normal pregnancy 10/14/2015  . DYSPNEA 06/01/2009   Past Medical History:  Diagnosis Date  . Medical history non-contributory    Past Surgical History: Past Surgical History:  Procedure Laterality Date  . CESAREAN SECTION N/A 10/14/2015   Procedure: CESAREAN SECTION;  Surgeon: Myna Hidalgo, DO;  Location: [facility];  Service: Obstetrics;  Laterality: N/A;  . CESAREAN SECTION N/A 10/13/2018   Procedure: REPEAT CESAREAN SECTION;  Surgeon: Myna Hidalgo, DO;  Location: [facility];  Service: Obstetrics;  Laterality: N/A;  Heather, RNFA  . TONSILLECTOMY     Medication List:  Current  Outpatient Medications  Medication Sig Dispense Refill  . Prenatal Vit-Fe Fumarate-FA (PRENATAL MULTIVITAMIN) TABS tablet Take 1 tablet by mouth at bedtime.     Marland Kitchen PROAIR HFA 108 (90 Base) MCG/ACT inhaler     . sertraline (ZOLOFT) 50 MG tablet Take 50 mg by mouth daily.    . budesonide-formoterol (SYMBICORT) 160-4.5 MCG/ACT inhaler Inhale 2 puffs into the lungs 2 (two) times daily. 1 Inhaler 5  . ibuprofen (ADVIL,MOTRIN) 600 MG tablet Take 1 tablet (600 mg total) by mouth every 6 (six) hours as needed for moderate pain. (Patient not taking: Reported on 12/18/2018) 30 tablet 0  . norethindrone (MICRONOR,CAMILA,ERRIN) 0.35 MG tablet Take 1 tablet by mouth daily.     No current facility-administered medications for this visit.    Allergies: Allergies  Allergen Reactions  . Amoxicillin     REACTION: Hives and swelling  . Penicillins Rash    Has patient had a PCN reaction causing immediate rash, facial/tongue/throat swelling, SOB or lightheadedness with hypotension: Yes Has patient had a PCN reaction causing severe rash involving mucus membranes or skin necrosis: Yes Has patient had a PCN reaction that required hospitalization no Has patient had a PCN reaction occurring within the last 10 years: No If all of the above answers are "NO", then may proceed with Cephalosporin use.   Social History: Social History   Socioeconomic History  . Marital status: Married    Spouse name: Not on file  . Number of children: Not on file  .  Years of education: Not on file  . Highest education level: Not on file  Occupational History  . Not on file  Social Needs  . Financial resource strain: Not hard at all  . Food insecurity:    Worry: Never true    Inability: Never true  . Transportation needs:    Medical: No    Non-medical: Not on file  Tobacco Use  . Smoking status: Never Smoker  . Smokeless tobacco: Never Used  Substance and Sexual Activity  . Alcohol use: No  . Drug use: No  . Sexual  activity: Not Currently  Lifestyle  . Physical activity:    Days per week: Not on file    Minutes per session: Not on file  . Stress: Only a little  Relationships  . Social connections:    Talks on phone: Not on file    Gets together: Not on file    Attends religious service: Not on file    Active member of club or organization: Not on file    Attends meetings of clubs or organizations: Not on file    Relationship status: Not on file  Other Topics Concern  . Not on file  Social History Narrative  . Not on file   Lives in a 33 year old home. Smoking: denies Occupation: Buyer, retail HistorySurveyor, minerals in the house: no Engineer, civil (consulting) in the family room: yes Carpet in the bedroom: yes Heating: electric Cooling: central Pet: yes 2 cats x 10 yrs, 4 yrs, 1 dog x 2 yrs  Family History: Family History  Problem Relation Age of Onset  . Diabetes Father   . Allergic rhinitis Father   . Diabetes Maternal Aunt   . Diabetes Paternal Aunt   . Diabetes Maternal Grandmother   . Diabetes Paternal Grandfather   . Asthma Neg Hx    Review of Systems  Constitutional: Negative for appetite change, chills, fever and unexpected weight change.  HENT: Negative for congestion and rhinorrhea.   Eyes: Negative for itching.  Respiratory: Positive for chest tightness, shortness of breath and wheezing. Negative for cough.   Cardiovascular: Negative for chest pain.  Gastrointestinal: Negative for abdominal pain.  Genitourinary: Negative for difficulty urinating.  Skin: Negative for rash.  Allergic/Immunologic: Positive for environmental allergies. Negative for food allergies.  Neurological: Negative for headaches.   Objective: BP (!) 136/92 (BP Location: Right Arm, Patient Position: Sitting, Cuff Size: Normal)   Pulse 67   Temp 98.1 F (36.7 C) (Oral)   Resp 18   Ht 5' (1.524 m)   Wt 165 lb (74.8 kg)   SpO2 97%   BMI 32.22 kg/m  Body mass index is 32.22  kg/m. Physical Exam  Constitutional: She is oriented to person, place, and time. She appears well-developed and well-nourished.  HENT:  Head: Normocephalic and atraumatic.  Right Ear: External ear normal.  Left Ear: External ear normal.  Nose: Nose normal.  Mouth/Throat: Oropharynx is clear and moist.  Eyes: Conjunctivae and EOM are normal.  Neck: Neck supple.  Cardiovascular: Normal rate, regular rhythm and normal heart sounds. Exam reveals no gallop and no friction rub.  No murmur heard. Pulmonary/Chest: Effort normal and breath sounds normal. She has no wheezes. She has no rales.  Abdominal: Soft.  Lymphadenopathy:    She has no cervical adenopathy.  Neurological: She is alert and oriented to person, place, and time.  Skin: Skin is warm. No rash noted.  Psychiatric: She has a normal mood and  affect. Her behavior is normal.  Nursing note and vitals reviewed.  The plan was reviewed with the patient/family, and all questions/concerned were addressed.  It was my pleasure to see Jessyka today and participate in her care. Please feel free to contact me with any questions or concerns.  Sincerely,  Wyline Mood, DO Allergy & Immunology  [facility] office: [phone] [facility] office: [phone]

## 2018-12-18 NOTE — Assessment & Plan Note (Signed)
Rhinitis symptoms for the past 20 years mainly during season changes.  Used over-the-counter medications with some benefit.  No recent ENT evaluation.  Unable to skin test today due to recent antihistamine intake. Return next week for skin testing.   Will make additional recommendations based on skin test results.

## 2018-12-18 NOTE — Assessment & Plan Note (Signed)
Broke out in hives as a child after amoxicillin.  Continue to avoid. Consider testing in future.  

## 2018-12-24 DIAGNOSIS — F53 Postpartum depression: Secondary | ICD-10-CM | POA: Diagnosis not present

## 2018-12-24 DIAGNOSIS — O99345 Other mental disorders complicating the puerperium: Secondary | ICD-10-CM | POA: Diagnosis not present

## 2018-12-25 ENCOUNTER — Ambulatory Visit (INDEPENDENT_AMBULATORY_CARE_PROVIDER_SITE_OTHER): Payer: BLUE CROSS/BLUE SHIELD | Admitting: Allergy

## 2018-12-25 ENCOUNTER — Encounter: Payer: Self-pay | Admitting: Allergy

## 2018-12-25 VITALS — BP 120/72 | HR 92 | Resp 16

## 2018-12-25 DIAGNOSIS — T50905D Adverse effect of unspecified drugs, medicaments and biological substances, subsequent encounter: Secondary | ICD-10-CM

## 2018-12-25 DIAGNOSIS — J3089 Other allergic rhinitis: Secondary | ICD-10-CM

## 2018-12-25 DIAGNOSIS — J4540 Moderate persistent asthma, uncomplicated: Secondary | ICD-10-CM | POA: Diagnosis not present

## 2018-12-25 NOTE — Progress Notes (Deleted)
New Patient Note  RE: Nicole Collins MRN: [id number] DOB: [birth year] Date of Office Visit: 12/25/2018  Referring provider: Blair Heys, MD Primary care provider: Blair Heys, MD  Chief Complaint: Allergy Testing  History of Present Illness: I had the pleasure of seeing Nicole Collins for [facility]  on 12/25/2018. She is a 33 y.o. female, who is referred here by Blair Heys, MD for the evaluation of shortness of breath.   She reports symptoms of chest tightness/heaviness, shortness of breath, wheezing for 2 months. This usually occurs while awake and can happen at rest. This has happened mainly in her home. Current medications include albuterol prn which help. She reports not using aerochamber with inhalers. She tried the following inhalers: Advair Diskus used in high school with good benefit. Apparently she had asthma triggers from her dog. These symptoms resolved after she went away to college. She does have a dog at home now for the past 2 years. She noticed an asthma flare after her husband vacuumed the carpet.   In the last month, frequency of symptoms: few times/week. Frequency of nocturnal symptoms: 0x/month. Frequency of SABA use: few times/week. Interference with physical activity: unsure. Sleep is undisturbed. In the last 12 months, emergency room visits/urgent care visits/doctor office visits or hospitalizations due to respiratory issues: 1. In the last 12 months, oral steroids courses: no Lifetime history of hospitalization for respiratory issues: no. Prior intubations: no. History of pneumonia: no. She was not evaluated by allergist/pulmonologist in the past. Smoking exposure: no. Up to date with flu vaccine: yes.   Patient had C-section on 10/13/2018 with no issues. No history of pulmonary embolism or DVTs. She had an episode of syncope in January 2020.   Assessment and Plan: Nicole Collins is a 33 y.o. female with: No  problem-specific Assessment & Plan notes found for this encounter.  No follow-ups on file.  No orders of the defined types were placed in this encounter.  Other allergy screening: Rhino conjunctivitis: yes  She reports symptoms of sinusitis, sneezing, coughing, itchy nose. Symptoms have been going on for 20+ years. The symptoms are present during the weather changes. Anosmia: no. Headache: no. She has used tylenol sinus and nettipot with some improvement in symptoms. Sinus infections: yes. Previous work up includes: none. Previous ENT evaluation: yes but not recently. No previous sinus surgery.  Food allergy: no Medication allergy: yes  Amoxicillin - hives as a child Hymenoptera allergy: no Urticaria: no Eczema:yes slightly History of recurrent infections suggestive of immunodeficency: no  Diagnostics: Spirometry:  Tracings reviewed. Her effort: Good reproducible efforts. FVC: 1.74L FEV1: 1.44L, 52% predicted FEV1/FVC ratio: 83% Interpretation: 76% improvement in FEV1 post bronchodilator treatment and felt clinically improved.  Please see scanned spirometry results for details.  Skin Testing: Deferred due to recent antihistamines use.   Past Medical History: Patient Active Problem List   Diagnosis Date Noted  . Moderate persistent asthma without complication 12/18/2018  . Other allergic rhinitis 12/18/2018  . Drug reaction 12/18/2018  . Normal intrauterine pregnancy in third trimester 10/13/2018  . Intrauterine pregnancy 10/11/2018  . Intrauterine normal pregnancy 10/14/2015  . DYSPNEA 06/01/2009   Past Medical History:  Diagnosis Date  . Medical history non-contributory    Past Surgical History: Past Surgical History:  Procedure Laterality Date  . CESAREAN SECTION N/A 10/14/2015   Procedure: CESAREAN SECTION;  Surgeon: Myna Hidalgo, DO;  Location: [facility];  Service: Obstetrics;  Laterality: N/A;  . CESAREAN SECTION N/A  10/13/2018   Procedure: REPEAT CESAREAN  SECTION;  Surgeon: Myna Hidalgo, DO;  Location: [facility];  Service: Obstetrics;  Laterality: N/A;  Heather, RNFA  . TONSILLECTOMY     Medication List:  Current Outpatient Medications  Medication Sig Dispense Refill  . budesonide-formoterol (SYMBICORT) 160-4.5 MCG/ACT inhaler Inhale 2 puffs into the lungs 2 (two) times daily. 1 Inhaler 5  . ibuprofen (ADVIL,MOTRIN) 600 MG tablet Take 1 tablet (600 mg total) by mouth every 6 (six) hours as needed for moderate pain. 30 tablet 0  . norethindrone (MICRONOR,CAMILA,ERRIN) 0.35 MG tablet Take 1 tablet by mouth daily.    . Prenatal Vit-Fe Fumarate-FA (PRENATAL MULTIVITAMIN) TABS tablet Take 1 tablet by mouth at bedtime.     Marland Kitchen PROAIR HFA 108 (90 Base) MCG/ACT inhaler     . sertraline (ZOLOFT) 50 MG tablet Take 50 mg by mouth daily.     No current facility-administered medications for this visit.    Allergies: Allergies  Allergen Reactions  . Amoxicillin     REACTION: Hives and swelling  . Penicillins Rash    Has patient had a PCN reaction causing immediate rash, facial/tongue/throat swelling, SOB or lightheadedness with hypotension: Yes Has patient had a PCN reaction causing severe rash involving mucus membranes or skin necrosis: Yes Has patient had a PCN reaction that required hospitalization no Has patient had a PCN reaction occurring within the last 10 years: No If all of the above answers are "NO", then may proceed with Cephalosporin use.   Social History: Social History   Socioeconomic History  . Marital status: Married    Spouse name: Not on file  . Number of children: Not on file  . Years of education: Not on file  . Highest education level: Not on file  Occupational History  . Not on file  Social Needs  . Financial resource strain: Not hard at all  . Food insecurity:    Worry: Never true    Inability: Never true  . Transportation needs:    Medical: No    Non-medical: Not on file  Tobacco Use  . Smoking status:  Never Smoker  . Smokeless tobacco: Never Used  Substance and Sexual Activity  . Alcohol use: No  . Drug use: No  . Sexual activity: Not Currently  Lifestyle  . Physical activity:    Days per week: Not on file    Minutes per session: Not on file  . Stress: Only a little  Relationships  . Social connections:    Talks on phone: Not on file    Gets together: Not on file    Attends religious service: Not on file    Active member of club or organization: Not on file    Attends meetings of clubs or organizations: Not on file    Relationship status: Not on file  Other Topics Concern  . Not on file  Social History Narrative  . Not on file   Lives in a 33 year old home. Smoking: denies Occupation: Buyer, retail HistorySurveyor, minerals in the house: no Engineer, civil (consulting) in the family room: yes Carpet in the bedroom: yes Heating: electric Cooling: central Pet: yes 2 cats x 10 yrs, 4 yrs, 1 dog x 2 yrs  Family History: Family History  Problem Relation Age of Onset  . Diabetes Father   . Allergic rhinitis Father   . Diabetes Maternal Aunt   . Diabetes Paternal Aunt   . Diabetes Maternal Grandmother   .  Diabetes Paternal Grandfather   . Asthma Neg Hx    Review of Systems  Constitutional: Negative for appetite change, chills, fever and unexpected weight change.  HENT: Negative for congestion and rhinorrhea.   Eyes: Negative for itching.  Respiratory: Positive for chest tightness, shortness of breath and wheezing. Negative for cough.   Cardiovascular: Negative for chest pain.  Gastrointestinal: Negative for abdominal pain.  Genitourinary: Negative for difficulty urinating.  Skin: Negative for rash.  Allergic/Immunologic: Positive for environmental allergies. Negative for food allergies.  Neurological: Negative for headaches.   Objective: BP 120/72 (BP Location: Left Arm, Patient Position: Sitting, Cuff Size: Normal)   Pulse 92   Resp 16  There is no height  or weight on file to calculate BMI. Physical Exam  Constitutional: She is oriented to person, place, and time. She appears well-developed and well-nourished.  HENT:  Head: Normocephalic and atraumatic.  Right Ear: External ear normal.  Left Ear: External ear normal.  Nose: Nose normal.  Mouth/Throat: Oropharynx is clear and moist.  Eyes: Conjunctivae and EOM are normal.  Neck: Neck supple.  Cardiovascular: Normal rate, regular rhythm and normal heart sounds. Exam reveals no gallop and no friction rub.  No murmur heard. Pulmonary/Chest: Effort normal and breath sounds normal. She has no wheezes. She has no rales.  Abdominal: Soft.  Lymphadenopathy:    She has no cervical adenopathy.  Neurological: She is alert and oriented to person, place, and time.  Skin: Skin is warm. No rash noted.  Psychiatric: She has a normal mood and affect. Her behavior is normal.  Nursing note and vitals reviewed.  The plan was reviewed with the patient/family, and all questions/concerned were addressed.  It was my pleasure to see Nicole Collins today and participate in her care. Please feel free to contact me with any questions or concerns.  Sincerely,  Wyline Mood, DO Allergy & Immunology  [facility] office: [phone] [facility] office: [phone]

## 2018-12-25 NOTE — Assessment & Plan Note (Signed)
Past history - had asthma as a high schooler and main trigger was dog dander. Asymptomatic up until 2 months ago after she had a C-section. Has been having chest tightness, wheezing and shortness of breath episode mainly at home. She does have a dog who sleeps in the bed at times for the last 2 years. There was a concern if her asthma played a part in a most recent syncopal episode in January 2020.  Interim history - symptoms have significantly improved with Symbicort.  Continue environmental control measures especially with regarding the pets. . Daily controller medication(s): Symbicort 160 2 puffs twice a day and rinse mouth afterwards. . Prior to physical activity: May use albuterol rescue inhaler 2 puffs 5 to 15 minutes prior to strenuous physical activities. Marland Kitchen Rescue medications: May use albuterol rescue inhaler 2 puffs or nebulizer every 4 to 6 hours as needed for shortness of breath, chest tightness, coughing, and wheezing. Monitor frequency of use.  . If doing well at the next visit we will stepdown therapy at that time.

## 2018-12-25 NOTE — Progress Notes (Signed)
Follow Up Note  RE: Nicole Collins MRN: [id number] DOB: Mar 13, 1986 Date of Office Visit: 12/25/2018  Referring provider: Blair Heys, MD Primary care provider: Blair Heys, MD  Chief Complaint: Allergy Testing  History of Present Illness: I had the pleasure of seeing Nicole Collins for a follow up visit at the [facility] [place] on 12/25/2018. She is a 33 y.o. female, who is being followed for asthma, allergic rhinitis, adverse drug reaction. Today she is here for skin testing. Her previous allergy office visit was on 12/18/2018 with Dr. Selena Batten.   Asthma: Breathing doing much better with Symbicort.  She has removed the pets from the bedroom area as well.  Assessment and Plan: Nicole Collins is a 33 y.o. female with: Other allergic rhinitis Past history - rhinitis symptoms for the past 20 years mainly during season changes.  Used over-the-counter medications with some benefit.  No recent ENT evaluation. Interim history - symptoms minimal.   Today's skin testing showed: Positive to dust mites, cat, dog and horse, borderline to mold.   Discussed environmental control measures.  May use over the counter antihistamines such as Zyrtec (cetirizine), Claritin (loratadine), Allegra (fexofenadine), or Xyzal (levocetirizine) daily as needed.  If above regimen does not control rhinitis and asthma symptoms then will discuss allergy immunotherapy in more detail at the next visit.  Moderate persistent asthma without complication Past history - had asthma as a high schooler and main trigger was dog dander. Asymptomatic up until 2 months ago after she had a C-section. Has been having chest tightness, wheezing and shortness of breath episode mainly at home. She does have a dog who sleeps in the bed at times for the last 2 years. There was a concern if her asthma played a part in a most recent syncopal episode in January 2020.  Interim history - symptoms have significantly improved with  Symbicort.  Continue environmental control measures especially with regarding the pets. . Daily controller medication(s): Symbicort 160 2 puffs twice a day and rinse mouth afterwards. . Prior to physical activity: May use albuterol rescue inhaler 2 puffs 5 to 15 minutes prior to strenuous physical activities. Marland Kitchen Rescue medications: May use albuterol rescue inhaler 2 puffs or nebulizer every 4 to 6 hours as needed for shortness of breath, chest tightness, coughing, and wheezing. Monitor frequency of use.  . If doing well at the next visit we will stepdown therapy at that time.  Drug reaction Broke out in hives as a child after amoxicillin.  Continue to avoid. Consider testing in future.   Return in about 2 months (around 02/24/2019).  Diagnostics: Spirometry:  Tracings reviewed. Her effort: Good reproducible efforts. FVC: 3.22L FEV1: 2.52L, 91% predicted FEV1/FVC ratio: 78% Interpretation: Spirometry consistent with normal pattern.  Please see scanned spirometry results for details.  Skin Testing: Environmental allergy panel. Positive test to: dust mites, cat, dog and horse, borderline to mold. Results discussed with patient/family. Airborne Adult Perc - 12/25/18 1537    Time Antigen Placed  0335    Allergen Manufacturer  Waynette Buttery    Location  Back    Number of Test  59    Panel 1  Select    1. Control-Buffer 50% Glycerol  Negative    2. Control-Histamine 1 mg/ml  3+    3. Albumin saline  Negative    4. Bahia  Negative    5. French Southern Territories  Negative    6. Johnson  Negative    7. Kentucky Blue  Negative  8. Meadow Fescue  Negative    9. Perennial Rye  Negative    10. Sweet Vernal  Negative    11. Timothy  Negative    12. Cocklebur  Negative    13. Burweed Marshelder  Negative    14. Ragweed, short  Negative    15. Ragweed, Giant  Negative    16. Plantain,  English  Negative    17. Lamb's Quarters  Negative    18. Sheep Sorrell  Negative    19. Rough Pigweed  Negative    20.  Marsh Elder, Rough  Negative    21. Mugwort, Common  Negative    22. Ash mix  Negative    23. Birch mix  Negative    24. Beech American  Negative    25. Box, Elder  Negative    26. Cedar, red  Negative    27. Cottonwood, Guinea-Bissau  Negative    28. Elm mix  Negative    29. Hickory mix  Negative    30. Maple mix  Negative    31. Oak, Guinea-Bissau mix  Negative    32. Pecan Pollen  Negative    33. Pine mix  Negative    34. Sycamore Eastern  Negative    35. Walnut, Black Pollen  Negative    36. Alternaria alternata  Negative    37. Cladosporium Herbarum  Negative    38. Aspergillus mix  Negative    39. Penicillium mix  Negative    40. Bipolaris sorokiniana (Helminthosporium)  Negative    41. Drechslera spicifera (Curvularia)  Negative    42. Mucor plumbeus  Negative    43. Fusarium moniliforme  Negative    44. Aureobasidium pullulans (pullulara)  Negative    45. Rhizopus oryzae  Negative    46. Botrytis cinera  Negative    47. Epicoccum nigrum  Negative    48. Phoma betae  Negative    49. Candida Albicans  Negative    50. Trichophyton mentagrophytes  Negative    51. Mite, D Farinae  5,000 AU/ml  4+    52. Mite, D Pteronyssinus  5,000 AU/ml  4+    53. Cat Hair 10,000 BAU/ml  4+    54.  Dog Epithelia  4+    55. Mixed Feathers  Negative    56. Horse Epithelia  2+    57. Cockroach, German  Negative    58. Mouse  Negative    59. Tobacco Leaf  Negative     Intradermal - 12/25/18 1645    Time Antigen Placed  0345    Allergen Manufacturer  Waynette Buttery    Location  Arm    Number of Test  12    Intradermal  Select    Control  Negative    French Southern Territories  Negative    Johnson  Negative    7 Grass  Negative    Ragweed mix  Negative    Weed mix  Negative    Tree mix  Negative    Mold 1  Negative    Mold 2  2+    Mold 3  Negative    Mold 4  Negative    Cockroach  Negative       Medication List:  Current Outpatient Medications  Medication Sig Dispense Refill  . budesonide-formoterol  (SYMBICORT) 160-4.5 MCG/ACT inhaler Inhale 2 puffs into the lungs 2 (two) times daily. 1 Inhaler 5  . ibuprofen (ADVIL,MOTRIN) 600 MG tablet Take 1 tablet (  600 mg total) by mouth every 6 (six) hours as needed for moderate pain. 30 tablet 0  . norethindrone (MICRONOR,CAMILA,ERRIN) 0.35 MG tablet Take 1 tablet by mouth daily.    . Prenatal Vit-Fe Fumarate-FA (PRENATAL MULTIVITAMIN) TABS tablet Take 1 tablet by mouth at bedtime.     Marland Kitchen PROAIR HFA 108 (90 Base) MCG/ACT inhaler     . sertraline (ZOLOFT) 50 MG tablet Take 50 mg by mouth daily.     No current facility-administered medications for this visit.    Allergies: Allergies  Allergen Reactions  . Amoxicillin     REACTION: Hives and swelling  . Penicillins Rash    Has patient had a PCN reaction causing immediate rash, facial/tongue/throat swelling, SOB or lightheadedness with hypotension: Yes Has patient had a PCN reaction causing severe rash involving mucus membranes or skin necrosis: Yes Has patient had a PCN reaction that required hospitalization no Has patient had a PCN reaction occurring within the last 10 years: No If all of the above answers are "NO", then may proceed with Cephalosporin use.   I reviewed her past medical history, social history, family history, and environmental history and no significant changes have been reported from previous visit on 12/18/2018.  Review of Systems  Constitutional: Negative for appetite change, chills, fever and unexpected weight change.  HENT: Negative for congestion and rhinorrhea.   Eyes: Negative for itching.  Respiratory: Negative for cough, chest tightness, shortness of breath and wheezing.   Cardiovascular: Negative for chest pain.  Gastrointestinal: Negative for abdominal pain.  Genitourinary: Negative for difficulty urinating.  Skin: Negative for rash.  Allergic/Immunologic: Positive for environmental allergies. Negative for food allergies.  Neurological: Negative for headaches.    Objective: BP 120/72 (BP Location: Left Arm, Patient Position: Sitting, Cuff Size: Normal)   Pulse 92   Resp 16  There is no height or weight on file to calculate BMI. Physical Exam  Constitutional: She is oriented to person, place, and time. She appears well-developed and well-nourished.  HENT:  Head: Normocephalic and atraumatic.  Right Ear: External ear normal.  Left Ear: External ear normal.  Nose: Nose normal.  Mouth/Throat: Oropharynx is clear and moist.  Eyes: Conjunctivae and EOM are normal.  Neck: Neck supple.  Cardiovascular: Normal rate, regular rhythm and normal heart sounds. Exam reveals no gallop and no friction rub.  No murmur heard. Pulmonary/Chest: Effort normal and breath sounds normal. She has no wheezes. She has no rales.  Abdominal: Soft.  Lymphadenopathy:    She has no cervical adenopathy.  Neurological: She is alert and oriented to person, place, and time.  Skin: Skin is warm. No rash noted.  Psychiatric: She has a normal mood and affect. Her behavior is normal.  Nursing note and vitals reviewed.  Previous notes and tests were reviewed. The plan was reviewed with the patient/family, and all questions/concerned were addressed.  It was my pleasure to see Nicole Collins today and participate in her care. Please feel free to contact me with any questions or concerns.  Sincerely,  Wyline Mood, DO Allergy & Immunology  [facility] office: [phone] [facility] office: [phone]

## 2018-12-25 NOTE — Assessment & Plan Note (Signed)
Past history - rhinitis symptoms for the past 20 years mainly during season changes.  Used over-the-counter medications with some benefit.  No recent ENT evaluation. Interim history - symptoms minimal.   Today's skin testing showed: Positive to dust mites, cat, dog and horse, borderline to mold.   Discussed environmental control measures.  May use over the counter antihistamines such as Zyrtec (cetirizine), Claritin (loratadine), Allegra (fexofenadine), or Xyzal (levocetirizine) daily as needed.  If above regimen does not control rhinitis and asthma symptoms then will discuss allergy immunotherapy in more detail at the next visit.

## 2018-12-25 NOTE — Patient Instructions (Addendum)
Today's skin testing showed: Positive to dust mites, cat, dog and horse, borderline to mold.   Moderate persistent asthma without complication  Discussed removing pets from the bedroom area.   Daily controller medication(s):continue Symbicort 160 2 puffs twice a day and rinse mouth afterwards.  Prior to physical activity:May use albuterol rescue inhaler 2 puffs 5 to 15 minutes prior to strenuous physical activities.  Rescue medications:May use albuterol rescue inhaler 2 puffs or nebulizer every 4 to 6 hours as needed for shortness of breath, chest tightness, coughing, and wheezing. Monitor frequency of use.   Other allergic rhinitis  May use over the counter antihistamines such as Zyrtec (cetirizine), Claritin (loratadine), Allegra (fexofenadine), or Xyzal (levocetirizine) daily as needed.  Drug reaction Broke out in hives as a child after amoxicillin.  Continue to avoid. Consider testing in future.   Follow up in 2 months  Control of House Dust Mite Allergen . Dust mite allergens are a common trigger of allergy and asthma symptoms. While they can be found throughout the house, these microscopic creatures thrive in warm, humid environments such as bedding, upholstered furniture and carpeting. . Because so much time is spent in the bedroom, it is essential to reduce mite levels there.  . Encase pillows, mattresses, and box springs in special allergen-proof fabric covers or airtight, zippered plastic covers.  . Bedding should be washed weekly in hot water (130 F) and dried in a hot dryer. Allergen-proof covers are available for comforters and pillows that can't be regularly washed.  Reyes Ivan the allergy-proof covers every few months. Minimize clutter in the bedroom. Keep pets out of the bedroom.  Marland Kitchen Keep humidity less than 50% by using a dehumidifier or air conditioning. You can buy a humidity measuring device called a hygrometer to monitor this.  . If possible, replace carpets with  hardwood, linoleum, or washable area rugs. If that's not possible, vacuum frequently with a vacuum that has a HEPA filter. . Remove all upholstered furniture and non-washable window drapes from the bedroom. . Remove all non-washable stuffed toys from the bedroom.  Wash stuffed toys weekly. Pet Allergen Avoidance: . Contrary to popular opinion, there are no "hypoallergenic" breeds of dogs or cats. That is because people are not allergic to an animal's hair, but to an allergen found in the animal's saliva, dander (dead skin flakes) or urine. Pet allergy symptoms typically occur within minutes. For some people, symptoms can build up and become most severe 8 to 12 hours after contact with the animal. People with severe allergies can experience reactions in public places if dander has been transported on the pet owners' clothing. Marland Kitchen Keeping an animal outdoors is only a partial solution, since homes with pets in the yard still have higher concentrations of animal allergens. . Before getting a pet, ask your allergist to determine if you are allergic to animals. If your pet is already considered part of your family, try to minimize contact and keep the pet out of the bedroom and other rooms where you spend a great deal of time. . As with dust mites, vacuum carpets often or replace carpet with a hardwood floor, tile or linoleum. . High-efficiency particulate air (HEPA) cleaners can reduce allergen levels over time. . While dander and saliva are the source of cat and dog allergens, urine is the source of allergens from rabbits, hamsters, mice and [place] pigs; so ask a non-allergic family member to clean the animal's cage. . If you have a pet allergy, talk to  your allergist about the potential for allergy immunotherapy (allergy shots). This strategy can often provide long-term relief. Cockroach Allergen Avoidance Cockroaches are often found in the homes of densely populated urban areas, schools or commercial  buildings, but these creatures can lurk almost anywhere. This does not mean that you have a dirty house or living area. . Block all areas where roaches can enter the home. This includes crevices, wall cracks and windows.  . Cockroaches need water to survive, so fix and seal all leaky faucets and pipes. Have an exterminator go through the house when your family and pets are gone to eliminate any remaining roaches. Marland Kitchen Keep food in lidded containers and put pet food dishes away after your pets are done eating. Vacuum and sweep the floor after meals, and take out garbage and recyclables. Use lidded garbage containers in the kitchen. Wash dishes immediately after use and clean under stoves, refrigerators or toasters where crumbs can accumulate. Wipe off the stove and other kitchen surfaces and cupboards regularly. Mold Control . Mold and fungi can grow on a variety of surfaces provided certain temperature and moisture conditions exist.  . Outdoor molds grow on plants, decaying vegetation and soil. The major outdoor mold, Alternaria and Cladosporium, are found in very high numbers during hot and dry conditions. Generally, a late summer - fall peak is seen for common outdoor fungal spores. Rain will temporarily lower outdoor mold spore count, but counts rise rapidly when the rainy period ends. . The most important indoor molds are Aspergillus and Penicillium. Dark, humid and poorly ventilated basements are ideal sites for mold growth. The next most common sites of mold growth are the bathroom and the kitchen. Outdoor (Seasonal) Mold Control . Use air conditioning and keep windows closed. . Avoid exposure to decaying vegetation. Marland Kitchen Avoid leaf raking. . Avoid grain handling. . Consider wearing a face mask if working in moldy areas.  Indoor (Perennial) Mold Control  . Maintain humidity below 50%. . Get rid of mold growth on hard surfaces with water, detergent and, if necessary, 5% bleach (do not mix with other  cleaners). Then dry the area completely. If mold covers an area more than 10 square feet, consider hiring an indoor environmental professional. . For clothing, washing with soap and water is best. If moldy items cannot be cleaned and dried, throw them away. . Remove sources e.g. contaminated carpets. . Repair and seal leaking roofs or pipes. Using dehumidifiers in damp basements may be helpful, but empty the water and clean units regularly to prevent mildew from forming. All rooms, especially basements, bathrooms and kitchens, require ventilation and cleaning to deter mold and mildew growth. Avoid carpeting on concrete or damp floors, and storing items in damp areas.

## 2018-12-25 NOTE — Assessment & Plan Note (Signed)
Broke out in hives as a child after amoxicillin.  Continue to avoid. Consider testing in future.  

## 2019-01-06 ENCOUNTER — Emergency Department (HOSPITAL_COMMUNITY): Payer: BLUE CROSS/BLUE SHIELD

## 2019-01-06 ENCOUNTER — Encounter (HOSPITAL_COMMUNITY): Payer: Self-pay | Admitting: Radiology

## 2019-01-06 ENCOUNTER — Emergency Department (HOSPITAL_COMMUNITY)
Admission: EM | Admit: 2019-01-06 | Discharge: 2019-01-06 | Disposition: A | Discharge status: Home / Self Care | Payer: BLUE CROSS/BLUE SHIELD | Attending: Emergency Medicine | Admitting: Emergency Medicine

## 2019-01-06 ENCOUNTER — Other Ambulatory Visit: Payer: Self-pay

## 2019-01-06 DIAGNOSIS — R109 Unspecified abdominal pain: Secondary | ICD-10-CM | POA: Diagnosis not present

## 2019-01-06 DIAGNOSIS — R1013 Epigastric pain: Secondary | ICD-10-CM | POA: Diagnosis not present

## 2019-01-06 DIAGNOSIS — R1011 Right upper quadrant pain: Secondary | ICD-10-CM | POA: Diagnosis not present

## 2019-01-06 DIAGNOSIS — Z79899 Other long term (current) drug therapy: Secondary | ICD-10-CM | POA: Diagnosis not present

## 2019-01-06 DIAGNOSIS — A084 Viral intestinal infection, unspecified: Secondary | ICD-10-CM

## 2019-01-06 LAB — COMPREHENSIVE METABOLIC PANEL
ALT: 37 U/L (ref 0–44)
ANION GAP: 12 (ref 5–15)
AST: 24 U/L (ref 15–41)
Albumin: 4.5 g/dL (ref 3.5–5.0)
Alkaline Phosphatase: 119 U/L (ref 38–126)
BUN: 17 mg/dL (ref 6–20)
CO2: 24 mmol/L (ref 22–32)
Calcium: 9.7 mg/dL (ref 8.9–10.3)
Chloride: 103 mmol/L (ref 98–111)
Creatinine, Ser: 0.88 mg/dL (ref 0.44–1.00)
GFR calc Af Amer: >60 mL/min (ref >60)
GFR calc non Af Amer: >60 mL/min (ref >60)
Glucose, Bld: 117 mg/dL — ABNORMAL HIGH (ref 70–99)
POTASSIUM: 4 mmol/L (ref 3.5–5.1)
Sodium: 139 mmol/L (ref 135–145)
Total Bilirubin: 0.6 mg/dL (ref 0.3–1.2)
Total Protein: 7.1 g/dL (ref 6.5–8.1)

## 2019-01-06 LAB — URINALYSIS, ROUTINE W REFLEX MICROSCOPIC
Bilirubin Urine: NEGATIVE
Glucose, UA: NEGATIVE mg/dL
Hgb urine dipstick: NEGATIVE
Ketones, ur: 20 mg/dL — AB
Leukocytes,Ua: NEGATIVE
Nitrite: NEGATIVE
Protein, ur: NEGATIVE mg/dL
Specific Gravity, Urine: 1.043 — ABNORMAL HIGH (ref 1.005–1.030)
pH: 6 (ref 5.0–8.0)

## 2019-01-06 LAB — CBC
HCT: 42.3 % (ref 36.0–46.0)
HEMOGLOBIN: 13.1 g/dL (ref 12.0–15.0)
MCH: 27.7 pg (ref 26.0–34.0)
MCHC: 31 g/dL (ref 30.0–36.0)
MCV: 89.4 fL (ref 80.0–100.0)
Platelets: 327 10*3/uL (ref 150–400)
RBC: 4.73 MIL/uL (ref 3.87–5.11)
RDW: 12.8 % (ref 11.5–15.5)
WBC: 15.8 10*3/uL — ABNORMAL HIGH (ref 4.0–10.5)
nRBC: 0 % (ref 0.0–0.2)

## 2019-01-06 LAB — I-STAT BETA HCG BLOOD, ED (MC, WL, AP ONLY)

## 2019-01-06 LAB — LIPASE, BLOOD: Lipase: 38 U/L (ref 11–51)

## 2019-01-06 MED ORDER — SODIUM CHLORIDE 0.9 % IV BOLUS
1000.0000 mL | Freq: Once | INTRAVENOUS | Status: AC
  Administered 2019-01-06: 1000 mL via INTRAVENOUS

## 2019-01-06 MED ORDER — HYDROMORPHONE HCL 1 MG/ML IJ SOLN
0.5000 mg | Freq: Once | INTRAMUSCULAR | Status: AC
  Administered 2019-01-06: 0.5 mg via INTRAVENOUS
  Filled 2019-01-06: qty 1

## 2019-01-06 MED ORDER — FAMOTIDINE 20 MG IN NS 100 ML IVPB
20.0000 mg | INTRAVENOUS | Status: AC
  Administered 2019-01-06: 20 mg via INTRAVENOUS
  Filled 2019-01-06: qty 100

## 2019-01-06 MED ORDER — ONDANSETRON HCL 4 MG/2ML IJ SOLN
4.0000 mg | Freq: Once | INTRAMUSCULAR | Status: AC
  Administered 2019-01-06: 4 mg via INTRAVENOUS
  Filled 2019-01-06: qty 2

## 2019-01-06 MED ORDER — DICYCLOMINE HCL 10 MG/ML IM SOLN: 20.0000 mg | Freq: Once | INTRAMUSCULAR | Status: DC

## 2019-01-06 MED ORDER — SODIUM CHLORIDE 0.9% FLUSH: 3.0000 mL | Freq: Once | INTRAVENOUS | Status: DC

## 2019-01-06 MED ORDER — METOCLOPRAMIDE HCL 5 MG/ML IJ SOLN
10.0000 mg | INTRAMUSCULAR | Status: AC
  Administered 2019-01-06: 10 mg via INTRAVENOUS
  Filled 2019-01-06: qty 2

## 2019-01-06 MED ORDER — RANITIDINE HCL 150 MG PO TABS: 150.0000 mg | ORAL_TABLET | Freq: Two times a day (BID) | ORAL | 0 refills | Status: DC

## 2019-01-06 MED ORDER — LIDOCAINE VISCOUS HCL 2 % MT SOLN
15.0000 mL | Freq: Once | OROMUCOSAL | Status: AC
  Administered 2019-01-06: 15 mL via ORAL
  Filled 2019-01-06: qty 15

## 2019-01-06 MED ORDER — ALUM & MAG HYDROXIDE-SIMETH 200-200-20 MG/5ML PO SUSP
30.0000 mL | Freq: Once | ORAL | Status: AC
  Administered 2019-01-06: 30 mL via ORAL
  Filled 2019-01-06: qty 30

## 2019-01-06 MED ORDER — ONDANSETRON HCL 4 MG/2ML IJ SOLN: 4.0000 mg | Freq: Once | INTRAMUSCULAR | Status: DC

## 2019-01-06 MED ORDER — KETOROLAC TROMETHAMINE 30 MG/ML IJ SOLN
15.0000 mg | Freq: Once | INTRAMUSCULAR | Status: AC
  Administered 2019-01-06: 15 mg via INTRAVENOUS
  Filled 2019-01-06: qty 1

## 2019-01-06 MED ORDER — HYDROMORPHONE HCL 1 MG/ML IJ SOLN
1.0000 mg | Freq: Once | INTRAMUSCULAR | Status: AC
  Administered 2019-01-06: 1 mg via INTRAVENOUS
  Filled 2019-01-06: qty 1

## 2019-01-06 MED ORDER — IOHEXOL 300 MG/ML  SOLN
100.0000 mL | Freq: Once | INTRAMUSCULAR | Status: AC | PRN
  Administered 2019-01-06: 100 mL via INTRAVENOUS

## 2019-01-06 MED ORDER — ONDANSETRON 4 MG PO TBDP: 4.0000 mg | ORAL_TABLET | Freq: Three times a day (TID) | ORAL | 0 refills | Status: DC | PRN

## 2019-01-06 MED ORDER — MORPHINE SULFATE (PF) 4 MG/ML IV SOLN: 4.0000 mg | Freq: Once | INTRAVENOUS | Status: DC

## 2019-01-06 NOTE — Discharge Instructions (Signed)
Avoid fried foods, fatty foods, greasy foods, and milk products until symptoms resolve. Drink plenty of clear liquids. We recommend the use of Zofran as prescribed for nausea/vomiting. Follow-up with your primary care doctor to ensure resolution of symptoms. 

## 2019-01-06 NOTE — ED Provider Notes (Signed)
[facility] EMERGENCY DEPARTMENT Provider Note   CSN: [id number] Arrival date & time: 01/06/19  0015    History   Chief Complaint Chief Complaint  Patient presents with   Emesis    HPI Nicole Collins is a 33 y.o. female.     33 year old female presents to the emergency department for evaluation of epigastric pain, nausea, vomiting.  She reports onset of symptoms tonight after having a hamburger.  She has continued to have multiple episodes of emesis and dry heaves.  Emesis noted to be nonbloody, nonbilious.  She had similar symptoms a few days ago the morning after eating fried chicken for dinner.  Had vomiting for approximately 24 hours before spontaneous resolution.  She was asymptomatic for most of the day yesterday.  No medications taken prior to arrival for symptoms.  She has had 2 looser bowel movements earlier in the day.  No fever, melena, hematochezia, urinary symptoms.  Abdominal surgical history significant for cesarean section.  History of gallbladder disease in her mother and sister.  The history is provided by the patient and the spouse. No language interpreter was used.  Emesis    Past Medical History:  Diagnosis Date   Medical history non-contributory     Patient Active Problem List   Diagnosis Date Noted   Moderate persistent asthma without complication 12/18/2018   Other allergic rhinitis 12/18/2018   Drug reaction 12/18/2018   Normal intrauterine pregnancy in third trimester 10/13/2018   Intrauterine pregnancy 10/11/2018   Intrauterine normal pregnancy 10/14/2015   DYSPNEA 06/01/2009    Past Surgical History:  Procedure Laterality Date   CESAREAN SECTION N/A 10/14/2015   Procedure: CESAREAN SECTION;  Surgeon: Myna Hidalgo, DO;  Location: [facility];  Service: Obstetrics;  Laterality: N/A;   CESAREAN SECTION N/A 10/13/2018   Procedure: REPEAT CESAREAN SECTION;  Surgeon: Myna Hidalgo, DO;  Location: [facility];   Service: Obstetrics;  Laterality: N/A;  Heather, RNFA   TONSILLECTOMY       OB History    Gravida  2   Para  2   Term  2   Preterm  0   AB  0   Living  2     SAB  0   TAB  0   Ectopic  0   Multiple  0   Live Births  2            Home Medications    Prior to Admission medications   Medication Sig Start Date End Date Taking? Authorizing Provider  budesonide-formoterol (SYMBICORT) 160-4.5 MCG/ACT inhaler Inhale 2 puffs into the lungs 2 (two) times daily. 12/18/18  Yes Ellamae Sia, DO  ibuprofen (ADVIL,MOTRIN) 600 MG tablet Take 1 tablet (600 mg total) by mouth every 6 (six) hours as needed for moderate pain. 10/15/18  Yes Janeece Riggers, CNM  Prenatal Vit-Fe Fumarate-FA (PRENATAL MULTIVITAMIN) TABS tablet Take 1 tablet by mouth at bedtime.    Yes [provider]  PROAIR HFA [phone] Base) MCG/ACT inhaler Inhale 1-2 puffs into the lungs every 6 (six) hours as needed for wheezing or shortness of breath.  11/12/18  Yes [provider]  sertraline (ZOLOFT) 50 MG tablet Take 50 mg by mouth daily. 11/26/18  Yes [provider]  ondansetron (ZOFRAN ODT) 4 MG disintegrating tablet Take 1 tablet (4 mg total) by mouth every 8 (eight) hours as needed for nausea or vomiting. 01/06/19   Antony Madura, PA-C  ranitidine (ZANTAC) 150 MG tablet  Take 1 tablet (150 mg total) by mouth 2 (two) times daily. 01/06/19   Antony Madura, PA-C    Family History Family History  Problem Relation Age of Onset   Diabetes Father    Allergic rhinitis Father    Diabetes Maternal Aunt    Diabetes Paternal Aunt    Diabetes Maternal Grandmother    Diabetes Paternal Grandfather    Asthma Neg Hx     Social History Social History   Tobacco Use   Smoking status: Never Smoker   Smokeless tobacco: Never Used  Substance Use Topics   Alcohol use: No   Drug use: No     Allergies   Amoxicillin and Penicillins   Review of Systems Review of Systems    Gastrointestinal: Positive for vomiting.  Ten systems reviewed and are negative for acute change, except as noted in the HPI.     Physical Exam Updated Vital Signs BP 116/64    Pulse 88    Temp 97.9 F (36.6 C) (Oral)    Resp 16    Ht 5' (1.524 m)    Wt 72.6 kg    SpO2 98%    BMI 31.25 kg/m   Physical Exam Vitals signs and nursing note reviewed.  Constitutional:      General: She is not in acute distress.    Appearance: She is well-developed. She is not diaphoretic.     Comments: Patient appears uncomfortable, actively dry heaving.  HENT:     Head: Normocephalic and atraumatic.  Eyes:     General: No scleral icterus.    Conjunctiva/sclera: Conjunctivae normal.  Neck:     Musculoskeletal: Normal range of motion.  Cardiovascular:     Rate and Rhythm: Normal rate and regular rhythm.     Pulses: Normal pulses.  Pulmonary:     Effort: Pulmonary effort is normal. No respiratory distress.     Comments: Respirations even and unlabored Abdominal:     Palpations: There is no mass.     Tenderness: There is abdominal tenderness.     Hernia: No hernia is present.     Comments: Abdomen soft without peritoneal signs.  There is generalized tenderness, worse in the epigastrium.  No involuntary guarding.  Musculoskeletal: Normal range of motion.  Skin:    General: Skin is warm and dry.     Coloration: Skin is not pale.     Findings: No erythema or rash.  Neurological:     Mental Status: She is alert and oriented to person, place, and time.  Psychiatric:        Behavior: Behavior normal.      ED Treatments / Results  Labs (all labs ordered are listed, but only abnormal results are displayed) Labs Reviewed  COMPREHENSIVE METABOLIC PANEL - Abnormal; Notable for the following components:      Result Value   Glucose, Bld 117 (*)    All other components within normal limits  CBC - Abnormal; Notable for the following components:   WBC 15.8 (*)    All other components within normal  limits  URINALYSIS, ROUTINE W REFLEX MICROSCOPIC - Abnormal; Notable for the following components:   Specific Gravity, Urine 1.043 (*)    Ketones, ur 20 (*)    All other components within normal limits  LIPASE, BLOOD  I-STAT BETA HCG BLOOD, ED ([facility], [facility], [facility] ONLY)    EKG None  Radiology Ct Abdomen Pelvis W Contrast  Result Date: 01/06/2019 CLINICAL DATA:  Central abdominal pain EXAM:  CT ABDOMEN AND PELVIS WITH CONTRAST TECHNIQUE: Multidetector CT imaging of the abdomen and pelvis was performed using the standard protocol following bolus administration of intravenous contrast. CONTRAST:  OMNIPAQUE IOHEXOL 300 MG/ML  SOLN COMPARISON:  None. FINDINGS: LOWER CHEST: There is no basilar pleural or apical pericardial effusion. HEPATOBILIARY: The hepatic contours and density are normal. There is no intra- or extrahepatic biliary dilatation. The gallbladder is normal. PANCREAS: The pancreatic parenchymal contours are normal and there is no ductal dilatation. There is no peripancreatic fluid collection. SPLEEN: Normal. ADRENALS/URINARY TRACT: --Adrenal glands: Normal. --Right kidney/ureter: No hydronephrosis, nephroureterolithiasis, perinephric stranding or solid renal mass. --Left kidney/ureter: No hydronephrosis, nephroureterolithiasis, perinephric stranding or solid renal mass. --Urinary bladder: Normal for degree of distention STOMACH/BOWEL: --Stomach/Duodenum: There is no hiatal hernia or other gastric abnormality. The duodenal course and caliber are normal. --Small bowel: No dilatation or inflammation. --Colon: No focal abnormality. --Appendix: Normal. VASCULAR/LYMPHATIC: Normal course and caliber of the major abdominal vessels. No abdominal or pelvic lymphadenopathy. REPRODUCTIVE: Normal uterus and ovaries. MUSCULOSKELETAL. No bony spinal canal stenosis or focal osseous abnormality. OTHER: None. IMPRESSION: No acute abnormality of the abdomen or pelvis. Electronically Signed   By: Deatra Robinson M.D.    On: 01/06/2019 03:35   US Abdomen Limited  Result Date: 01/06/2019 CLINICAL DATA:  Right upper quadrant pain EXAM: ULTRASOUND ABDOMEN LIMITED RIGHT UPPER QUADRANT COMPARISON:  None. FINDINGS: Gallbladder: No gallstones or wall thickening visualized. No sonographic Murphy sign noted by sonographer. Common bile duct: Diameter: Normal caliber, 2 mm Liver: No focal lesion identified. Within normal limits in parenchymal echogenicity. Portal vein is patent on color Doppler imaging with normal direction of blood flow towards the liver. IMPRESSION: Normal right upper quadrant ultrasound. Electronically Signed   By: Charlett Nose M.D.   On: 01/06/2019 02:01    Procedures Procedures (including critical care time)  Medications Ordered in ED Medications  sodium chloride flush (NS) 0.9 % injection 3 mL (3 mLs Intravenous Not Given 01/06/19 0056)  sodium chloride 0.9 % bolus 1,000 mL (0 mLs Intravenous Stopped 01/06/19 0220)  HYDROmorphone (DILAUDID) injection 1 mg (1 mg Intravenous Given 01/06/19 0054)  metoCLOPramide (REGLAN) injection 10 mg (10 mg Intravenous Given 01/06/19 0054)  famotidine (PEPCID) IVPB 20 mg in NS 100 mL IVPB (0 mg Intravenous Stopped 01/06/19 0311)  HYDROmorphone (DILAUDID) injection 0.5 mg (0.5 mg Intravenous Given 01/06/19 0230)  sodium chloride 0.9 % bolus 1,000 mL (0 mLs Intravenous Stopped 01/06/19 0341)  ketorolac (TORADOL) 30 MG/ML injection 15 mg (15 mg Intravenous Given 01/06/19 0230)  sodium chloride 0.9 % bolus 1,000 mL (1,000 mLs Intravenous New Bag/Given 01/06/19 0350)  iohexol (OMNIPAQUE) 300 MG/ML solution 100 mL (100 mLs Intravenous Contrast Given 01/06/19 0312)  alum & mag hydroxide-simeth (MAALOX/MYLANTA) 200-200-20 MG/5ML suspension 30 mL (30 mLs Oral Given 01/06/19 0436)    And  lidocaine (XYLOCAINE) 2 % viscous mouth solution 15 mL (15 mLs Oral Given 01/06/19 0436)  ondansetron (ZOFRAN) injection 4 mg (4 mg Intravenous Given 01/06/19 0435)     Initial Impression /  Assessment and Plan / ED Course  I have reviewed the triage vital signs and the nursing notes.  Pertinent labs & imaging results that were available during my care of the patient were reviewed by me and considered in my medical decision making (see chart for details).        Patient with symptoms consistent with viral gastroenteritis.  Notes vomiting and diarrhea with associated epigastric discomfort.  Vitals are stable, no fever.  Labs with leukocytosis.  No significant electrolyte derangements.  Patient underwent ultrasound imaging of the right upper quadrant as well as a CT of her abdomen and pelvis.  Imaging today shows no acute or emergent process.  She has been hydrated with 3 L IV fluids.  Is feeling much better after pain medication and antiemetics.  Now tolerating oral fluids.  Will continue with outpatient management with Zofran.  Encouraged primary care follow-up for recheck.  Return precautions discussed and provided. Patient discharged in stable condition with no unaddressed concerns.   Final Clinical Impressions(s) / ED Diagnoses   Final diagnoses:  Viral gastroenteritis    ED Discharge Orders         Ordered    ranitidine (ZANTAC) 150 MG tablet  2 times daily     01/06/19 0430    ondansetron (ZOFRAN ODT) 4 MG disintegrating tablet  Every 8 hours PRN     01/06/19 0430           Antony Madura, PA-C 01/06/19 0438    Nira Conn, MD 01/06/19 [phone]

## 2019-01-06 NOTE — ED Triage Notes (Signed)
Pt reports epigastric pain, N/V onset earlier this evening. Pt appears to be in discomfort triage.

## 2019-02-25 ENCOUNTER — Ambulatory Visit: Payer: BLUE CROSS/BLUE SHIELD | Admitting: Allergy

## 2019-05-12 DIAGNOSIS — J069 Acute upper respiratory infection, unspecified: Secondary | ICD-10-CM | POA: Diagnosis not present

## 2019-05-29 ENCOUNTER — Encounter (HOSPITAL_COMMUNITY): Payer: Self-pay

## 2019-05-29 ENCOUNTER — Observation Stay (HOSPITAL_COMMUNITY)
Admission: EM | Admit: 2019-05-29 | Discharge: 2019-05-31 | Disposition: A | Discharge status: Home / Self Care | Payer: BC Managed Care – PPO | Attending: Surgery | Admitting: Surgery

## 2019-05-29 DIAGNOSIS — K3580 Unspecified acute appendicitis: Principal | ICD-10-CM | POA: Diagnosis present

## 2019-05-29 DIAGNOSIS — R1110 Vomiting, unspecified: Secondary | ICD-10-CM | POA: Diagnosis not present

## 2019-05-29 DIAGNOSIS — Z20828 Contact with and (suspected) exposure to other viral communicable diseases: Secondary | ICD-10-CM | POA: Insufficient documentation

## 2019-05-29 DIAGNOSIS — J45909 Unspecified asthma, uncomplicated: Secondary | ICD-10-CM | POA: Diagnosis not present

## 2019-05-29 DIAGNOSIS — Z9049 Acquired absence of other specified parts of digestive tract: Secondary | ICD-10-CM

## 2019-05-29 DIAGNOSIS — E669 Obesity, unspecified: Secondary | ICD-10-CM | POA: Diagnosis not present

## 2019-05-29 DIAGNOSIS — Z03818 Encounter for observation for suspected exposure to other biological agents ruled out: Secondary | ICD-10-CM | POA: Diagnosis not present

## 2019-05-29 LAB — COMPREHENSIVE METABOLIC PANEL
ALT: 22 U/L (ref 0–44)
AST: 21 U/L (ref 15–41)
Albumin: 4.8 g/dL (ref 3.5–5.0)
Alkaline Phosphatase: 124 U/L (ref 38–126)
Anion gap: 14 (ref 5–15)
BUN: 12 mg/dL (ref 6–20)
CO2: 20 mmol/L — ABNORMAL LOW (ref 22–32)
Calcium: 9.5 mg/dL (ref 8.9–10.3)
Chloride: 102 mmol/L (ref 98–111)
Creatinine, Ser: 0.78 mg/dL (ref 0.44–1.00)
GFR calc Af Amer: >60 mL/min (ref >60)
GFR calc non Af Amer: >60 mL/min (ref >60)
Glucose, Bld: 117 mg/dL — ABNORMAL HIGH (ref 70–99)
Potassium: 3.8 mmol/L (ref 3.5–5.1)
Sodium: 136 mmol/L (ref 135–145)
Total Bilirubin: 0.6 mg/dL (ref 0.3–1.2)
Total Protein: 8.1 g/dL (ref 6.5–8.1)

## 2019-05-29 LAB — URINALYSIS, ROUTINE W REFLEX MICROSCOPIC
Bacteria, UA: NONE SEEN
Bilirubin Urine: NEGATIVE
Glucose, UA: NEGATIVE mg/dL
Hgb urine dipstick: NEGATIVE
Ketones, ur: 80 mg/dL — AB
Leukocytes,Ua: NEGATIVE
Nitrite: NEGATIVE
Protein, ur: 30 mg/dL — AB
Specific Gravity, Urine: 1.029 (ref 1.005–1.030)
pH: 8 (ref 5.0–8.0)

## 2019-05-29 LAB — LIPASE, BLOOD: Lipase: 31 U/L (ref 11–51)

## 2019-05-29 LAB — CBC
HCT: 42.5 % (ref 36.0–46.0)
Hemoglobin: 13.8 g/dL (ref 12.0–15.0)
MCH: 29.2 pg (ref 26.0–34.0)
MCHC: 32.5 g/dL (ref 30.0–36.0)
MCV: 90 fL (ref 80.0–100.0)
Platelets: 302 10*3/uL (ref 150–400)
RBC: 4.72 MIL/uL (ref 3.87–5.11)
RDW: 12.4 % (ref 11.5–15.5)
WBC: 17.8 10*3/uL — ABNORMAL HIGH (ref 4.0–10.5)
nRBC: 0 % (ref 0.0–0.2)

## 2019-05-29 LAB — I-STAT BETA HCG BLOOD, ED (MC, WL, AP ONLY): I-stat hCG, quantitative: <5 m[IU]/mL (ref <5)

## 2019-05-29 MED ORDER — ONDANSETRON 4 MG PO TBDP
4.0000 mg | ORAL_TABLET | Freq: Once | ORAL | Status: AC | PRN
  Administered 2019-05-29: 4 mg via ORAL
  Filled 2019-05-29: qty 1

## 2019-05-29 MED ORDER — SODIUM CHLORIDE 0.9% FLUSH
3.0000 mL | Freq: Once | INTRAVENOUS | Status: AC
  Administered 2019-05-30: 3 mL via INTRAVENOUS

## 2019-05-29 NOTE — ED Triage Notes (Signed)
Pt states that she has been having vomiting and chills, started today, RUQ pain, had similar episode in Feb

## 2019-05-30 ENCOUNTER — Encounter (HOSPITAL_COMMUNITY)
Admission: EM | Disposition: A | Discharge status: Home / Self Care | Payer: Self-pay | Source: Home / Self Care | Attending: Emergency Medicine

## 2019-05-30 ENCOUNTER — Emergency Department (HOSPITAL_COMMUNITY): Payer: BC Managed Care – PPO

## 2019-05-30 ENCOUNTER — Encounter (HOSPITAL_COMMUNITY): Payer: Self-pay | Admitting: Radiology

## 2019-05-30 ENCOUNTER — Observation Stay (HOSPITAL_COMMUNITY): Payer: BC Managed Care – PPO | Admitting: Anesthesiology

## 2019-05-30 DIAGNOSIS — Z9049 Acquired absence of other specified parts of digestive tract: Secondary | ICD-10-CM

## 2019-05-30 DIAGNOSIS — E669 Obesity, unspecified: Secondary | ICD-10-CM | POA: Diagnosis not present

## 2019-05-30 DIAGNOSIS — R1110 Vomiting, unspecified: Secondary | ICD-10-CM | POA: Diagnosis not present

## 2019-05-30 DIAGNOSIS — J45909 Unspecified asthma, uncomplicated: Secondary | ICD-10-CM | POA: Diagnosis not present

## 2019-05-30 DIAGNOSIS — K3580 Unspecified acute appendicitis: Secondary | ICD-10-CM | POA: Diagnosis not present

## 2019-05-30 DIAGNOSIS — J4540 Moderate persistent asthma, uncomplicated: Secondary | ICD-10-CM | POA: Diagnosis not present

## 2019-05-30 DIAGNOSIS — Z20828 Contact with and (suspected) exposure to other viral communicable diseases: Secondary | ICD-10-CM | POA: Diagnosis not present

## 2019-05-30 HISTORY — PX: LAPAROSCOPIC APPENDECTOMY: SHX408

## 2019-05-30 LAB — SARS CORONAVIRUS 2 BY RT PCR (HOSPITAL ORDER, PERFORMED IN ~~LOC~~ HOSPITAL LAB): SARS Coronavirus 2: NEGATIVE

## 2019-05-30 SURGERY — APPENDECTOMY, LAPAROSCOPIC
Anesthesia: General | Site: Abdomen

## 2019-05-30 MED ORDER — PROPOFOL 500 MG/50ML IV EMUL
INTRAVENOUS | Status: DC | PRN
  Administered 2019-05-30: 50 ug/kg/min via INTRAVENOUS

## 2019-05-30 MED ORDER — LACTATED RINGERS IV SOLN
INTRAVENOUS | Status: DC
  Administered 2019-05-30 (×2): via INTRAVENOUS

## 2019-05-30 MED ORDER — PROPOFOL 10 MG/ML IV BOLUS
INTRAVENOUS | Status: AC
  Filled 2019-05-30: qty 20

## 2019-05-30 MED ORDER — KCL IN DEXTROSE-NACL 20-5-0.45 MEQ/L-%-% IV SOLN
INTRAVENOUS | Status: DC
  Administered 2019-05-30: 12:00:00 via INTRAVENOUS
  Filled 2019-05-30: qty 1000

## 2019-05-30 MED ORDER — DIPHENHYDRAMINE HCL 50 MG/ML IJ SOLN: 12.5000 mg | Freq: Four times a day (QID) | INTRAMUSCULAR | Status: DC | PRN

## 2019-05-30 MED ORDER — MORPHINE SULFATE (PF) 4 MG/ML IV SOLN
4.0000 mg | Freq: Once | INTRAVENOUS | Status: AC
  Administered 2019-05-30: 4 mg via INTRAVENOUS
  Filled 2019-05-30: qty 1

## 2019-05-30 MED ORDER — MOMETASONE FURO-FORMOTEROL FUM 200-5 MCG/ACT IN AERO
2.0000 | INHALATION_SPRAY | Freq: Two times a day (BID) | RESPIRATORY_TRACT | Status: DC
  Administered 2019-05-31: 2 via RESPIRATORY_TRACT
  Filled 2019-05-30: qty 8.8

## 2019-05-30 MED ORDER — PHENYLEPHRINE 40 MCG/ML (10ML) SYRINGE FOR IV PUSH (FOR BLOOD PRESSURE SUPPORT)
PREFILLED_SYRINGE | INTRAVENOUS | Status: AC
  Filled 2019-05-30: qty 10

## 2019-05-30 MED ORDER — PRENATAL MULTIVITAMIN CH
1.0000 | ORAL_TABLET | Freq: Every day | ORAL | Status: DC
  Filled 2019-05-30 (×2): qty 1

## 2019-05-30 MED ORDER — SUCCINYLCHOLINE CHLORIDE 200 MG/10ML IV SOSY
PREFILLED_SYRINGE | INTRAVENOUS | Status: AC
  Filled 2019-05-30: qty 20

## 2019-05-30 MED ORDER — HYDROMORPHONE HCL 1 MG/ML IJ SOLN: 0.5000 mg | INTRAMUSCULAR | Status: DC | PRN

## 2019-05-30 MED ORDER — SUCCINYLCHOLINE CHLORIDE 20 MG/ML IJ SOLN: INTRAMUSCULAR | Status: DC | PRN

## 2019-05-30 MED ORDER — ACETAMINOPHEN 500 MG PO TABS
1000.0000 mg | ORAL_TABLET | Freq: Four times a day (QID) | ORAL | Status: DC
  Administered 2019-05-30 – 2019-05-31 (×3): 1000 mg via ORAL
  Filled 2019-05-30 (×4): qty 2

## 2019-05-30 MED ORDER — ALBUTEROL SULFATE (2.5 MG/3ML) 0.083% IN NEBU: 3.0000 mL | INHALATION_SOLUTION | Freq: Four times a day (QID) | RESPIRATORY_TRACT | Status: DC | PRN

## 2019-05-30 MED ORDER — POTASSIUM CHLORIDE 2 MEQ/ML IV SOLN
INTRAVENOUS | Status: DC
  Filled 2019-05-30 (×2): qty 1000

## 2019-05-30 MED ORDER — SUCCINYLCHOLINE CHLORIDE 20 MG/ML IJ SOLN
INTRAMUSCULAR | Status: DC | PRN
  Administered 2019-05-30: 60 mg via INTRAVENOUS

## 2019-05-30 MED ORDER — BUPIVACAINE-EPINEPHRINE 0.25% -1:200000 IJ SOLN
INTRAMUSCULAR | Status: DC | PRN
  Administered 2019-05-30: 21 mL

## 2019-05-30 MED ORDER — ONDANSETRON HCL 4 MG/2ML IJ SOLN
INTRAMUSCULAR | Status: AC
  Filled 2019-05-30: qty 4

## 2019-05-30 MED ORDER — SCOPOLAMINE 1 MG/3DAYS TD PT72
MEDICATED_PATCH | TRANSDERMAL | Status: AC
  Administered 2019-05-30: 1.5 mg via TRANSDERMAL
  Filled 2019-05-30: qty 1

## 2019-05-30 MED ORDER — SODIUM CHLORIDE 0.9 % IR SOLN
Status: DC | PRN
  Administered 2019-05-30: 1000 mL

## 2019-05-30 MED ORDER — FENTANYL CITRATE (PF) 100 MCG/2ML IJ SOLN
INTRAMUSCULAR | Status: AC
  Filled 2019-05-30: qty 2

## 2019-05-30 MED ORDER — IBUPROFEN 600 MG PO TABS: 600.0000 mg | ORAL_TABLET | Freq: Four times a day (QID) | ORAL | Status: DC | PRN

## 2019-05-30 MED ORDER — METRONIDAZOLE IN NACL 5-0.79 MG/ML-% IV SOLN
500.0000 mg | Freq: Once | INTRAVENOUS | Status: AC
  Administered 2019-05-30: 500 mg via INTRAVENOUS
  Filled 2019-05-30: qty 100

## 2019-05-30 MED ORDER — PROPOFOL 10 MG/ML IV BOLUS
INTRAVENOUS | Status: DC | PRN
  Administered 2019-05-30: 150 mg via INTRAVENOUS

## 2019-05-30 MED ORDER — FAMOTIDINE 20 MG PO TABS
20.0000 mg | ORAL_TABLET | Freq: Every day | ORAL | Status: DC
  Administered 2019-05-31: 20 mg via ORAL
  Filled 2019-05-30: qty 1

## 2019-05-30 MED ORDER — 0.9 % SODIUM CHLORIDE (POUR BTL) OPTIME
TOPICAL | Status: DC | PRN
  Administered 2019-05-30: 1000 mL

## 2019-05-30 MED ORDER — ROCURONIUM BROMIDE 10 MG/ML (PF) SYRINGE
PREFILLED_SYRINGE | INTRAVENOUS | Status: AC
  Filled 2019-05-30: qty 20

## 2019-05-30 MED ORDER — DIPHENHYDRAMINE HCL 12.5 MG/5ML PO ELIX: 12.5000 mg | ORAL_SOLUTION | Freq: Four times a day (QID) | ORAL | Status: DC | PRN

## 2019-05-30 MED ORDER — METHOCARBAMOL 500 MG PO TABS: 500.0000 mg | ORAL_TABLET | Freq: Four times a day (QID) | ORAL | Status: DC | PRN

## 2019-05-30 MED ORDER — DOCUSATE SODIUM 100 MG PO CAPS
100.0000 mg | ORAL_CAPSULE | Freq: Two times a day (BID) | ORAL | Status: DC
  Administered 2019-05-30 – 2019-05-31 (×2): 100 mg via ORAL
  Filled 2019-05-30 (×2): qty 1

## 2019-05-30 MED ORDER — DEXAMETHASONE SODIUM PHOSPHATE 10 MG/ML IJ SOLN
INTRAMUSCULAR | Status: AC
  Filled 2019-05-30: qty 2

## 2019-05-30 MED ORDER — FENTANYL CITRATE (PF) 250 MCG/5ML IJ SOLN
INTRAMUSCULAR | Status: AC
  Filled 2019-05-30: qty 5

## 2019-05-30 MED ORDER — ONDANSETRON HCL 4 MG/2ML IJ SOLN
4.0000 mg | Freq: Once | INTRAMUSCULAR | Status: AC
  Administered 2019-05-30: 4 mg via INTRAVENOUS
  Filled 2019-05-30: qty 2

## 2019-05-30 MED ORDER — SUGAMMADEX SODIUM 200 MG/2ML IV SOLN
INTRAVENOUS | Status: DC | PRN
  Administered 2019-05-30: 180 mg via INTRAVENOUS

## 2019-05-30 MED ORDER — IOHEXOL 300 MG/ML  SOLN
100.0000 mL | Freq: Once | INTRAMUSCULAR | Status: AC | PRN
  Administered 2019-05-30: 100 mL via INTRAVENOUS

## 2019-05-30 MED ORDER — SODIUM CHLORIDE 0.9 % IV SOLN
2.0000 g | Freq: Once | INTRAVENOUS | Status: AC
  Administered 2019-05-30: 2 g via INTRAVENOUS
  Filled 2019-05-30: qty 20

## 2019-05-30 MED ORDER — ACETAMINOPHEN 500 MG PO TABS
ORAL_TABLET | ORAL | Status: AC
  Administered 2019-05-30: 1000 mg via ORAL
  Filled 2019-05-30: qty 2

## 2019-05-30 MED ORDER — DEXAMETHASONE SODIUM PHOSPHATE 10 MG/ML IJ SOLN
INTRAMUSCULAR | Status: DC | PRN
  Administered 2019-05-30: 10 mg via INTRAVENOUS

## 2019-05-30 MED ORDER — SIMETHICONE 80 MG PO CHEW: 40.0000 mg | CHEWABLE_TABLET | Freq: Four times a day (QID) | ORAL | Status: DC | PRN

## 2019-05-30 MED ORDER — CELECOXIB 200 MG PO CAPS
ORAL_CAPSULE | ORAL | Status: AC
  Administered 2019-05-30: 400 mg via ORAL
  Filled 2019-05-30: qty 2

## 2019-05-30 MED ORDER — PROMETHAZINE HCL 25 MG/ML IJ SOLN: 6.2500 mg | INTRAMUSCULAR | Status: DC | PRN

## 2019-05-30 MED ORDER — ONDANSETRON HCL 4 MG/2ML IJ SOLN
INTRAMUSCULAR | Status: DC | PRN
  Administered 2019-05-30: 4 mg via INTRAVENOUS

## 2019-05-30 MED ORDER — BUPIVACAINE-EPINEPHRINE (PF) 0.25% -1:200000 IJ SOLN
INTRAMUSCULAR | Status: AC
  Filled 2019-05-30: qty 30

## 2019-05-30 MED ORDER — SODIUM CHLORIDE 0.9 % IV BOLUS
1000.0000 mL | Freq: Once | INTRAVENOUS | Status: AC
  Administered 2019-05-30: 1000 mL via INTRAVENOUS

## 2019-05-30 MED ORDER — ONDANSETRON HCL 4 MG/2ML IJ SOLN: 4.0000 mg | INTRAMUSCULAR | Status: DC | PRN

## 2019-05-30 MED ORDER — ROCURONIUM BROMIDE 10 MG/ML (PF) SYRINGE
PREFILLED_SYRINGE | INTRAVENOUS | Status: DC | PRN
  Administered 2019-05-30: 50 mg via INTRAVENOUS

## 2019-05-30 MED ORDER — FENTANYL CITRATE (PF) 100 MCG/2ML IJ SOLN
25.0000 ug | INTRAMUSCULAR | Status: DC | PRN
  Administered 2019-05-30: 25 ug via INTRAVENOUS

## 2019-05-30 MED ORDER — FENTANYL CITRATE (PF) 100 MCG/2ML IJ SOLN: INTRAMUSCULAR | Status: DC | PRN

## 2019-05-30 MED ORDER — LIDOCAINE 2% (20 MG/ML) 5 ML SYRINGE
INTRAMUSCULAR | Status: DC | PRN
  Administered 2019-05-30: 80 mg via INTRAVENOUS

## 2019-05-30 MED ORDER — PROPOFOL 10 MG/ML IV BOLUS: INTRAVENOUS | Status: DC | PRN

## 2019-05-30 MED ORDER — LIDOCAINE 2% (20 MG/ML) 5 ML SYRINGE
INTRAMUSCULAR | Status: AC
  Filled 2019-05-30: qty 10

## 2019-05-30 MED ORDER — HYDRALAZINE HCL 20 MG/ML IJ SOLN: 10.0000 mg | INTRAMUSCULAR | Status: DC | PRN

## 2019-05-30 MED ORDER — FENTANYL CITRATE (PF) 100 MCG/2ML IJ SOLN
INTRAMUSCULAR | Status: DC | PRN
  Administered 2019-05-30: 50 ug via INTRAVENOUS
  Administered 2019-05-30: 25 ug via INTRAVENOUS
  Administered 2019-05-30: 50 ug via INTRAVENOUS
  Administered 2019-05-30: 25 ug via INTRAVENOUS
  Administered 2019-05-30 (×2): 50 ug via INTRAVENOUS

## 2019-05-30 MED ORDER — TRAMADOL HCL 50 MG PO TABS: 50.0000 mg | ORAL_TABLET | Freq: Four times a day (QID) | ORAL | Status: DC | PRN

## 2019-05-30 MED ORDER — MORPHINE SULFATE (PF) 2 MG/ML IV SOLN: 1.0000 mg | INTRAVENOUS | Status: DC | PRN

## 2019-05-30 MED ORDER — MIDAZOLAM HCL 2 MG/2ML IJ SOLN
INTRAMUSCULAR | Status: AC
  Filled 2019-05-30: qty 2

## 2019-05-30 MED ORDER — SCOPOLAMINE 1 MG/3DAYS TD PT72
1.0000 | MEDICATED_PATCH | TRANSDERMAL | Status: DC
  Administered 2019-05-30: 1.5 mg via TRANSDERMAL

## 2019-05-30 MED ORDER — ACETAMINOPHEN 500 MG PO TABS
1000.0000 mg | ORAL_TABLET | Freq: Once | ORAL | Status: AC
  Administered 2019-05-30: 10:00:00 1000 mg via ORAL

## 2019-05-30 MED ORDER — HEPARIN SODIUM (PORCINE) 5000 UNIT/ML IJ SOLN
5000.0000 [IU] | Freq: Three times a day (TID) | INTRAMUSCULAR | Status: DC
  Administered 2019-05-30 – 2019-05-31 (×3): 5000 [IU] via SUBCUTANEOUS
  Filled 2019-05-30 (×3): qty 1

## 2019-05-30 MED ORDER — CELECOXIB 200 MG PO CAPS
400.0000 mg | ORAL_CAPSULE | Freq: Once | ORAL | Status: AC
  Administered 2019-05-30: 10:00:00 400 mg via ORAL

## 2019-05-30 SURGICAL SUPPLY — 47 items
ADH SKN CLS APL DERMABOND .7 (GAUZE/BANDAGES/DRESSINGS) ×1
APL PRP STRL LF DISP 70% ISPRP (MISCELLANEOUS) ×1
APPLIER CLIP 5 13 M/L LIGAMAX5 (MISCELLANEOUS)
APR CLP MED LRG 5 ANG JAW (MISCELLANEOUS)
BAG SPEC RTRVL LRG 6X4 10 (ENDOMECHANICALS) ×1
BLADE CLIPPER SURG (BLADE) IMPLANT
CANISTER SUCT 3000ML PPV (MISCELLANEOUS) ×1 IMPLANT
CHLORAPREP W/TINT 26 (MISCELLANEOUS) ×2 IMPLANT
CLIP APPLIE 5 13 M/L LIGAMAX5 (MISCELLANEOUS) IMPLANT
COVER SURGICAL LIGHT HANDLE (MISCELLANEOUS) ×2 IMPLANT
COVER WAND RF STERILE (DRAPES) ×2 IMPLANT
CUTTER FLEX LINEAR 45M (STAPLE) ×2 IMPLANT
DERMABOND ADVANCED (GAUZE/BANDAGES/DRESSINGS) ×1
DERMABOND ADVANCED .7 DNX12 (GAUZE/BANDAGES/DRESSINGS) ×1 IMPLANT
ELECT REM PT RETURN 9FT ADLT (ELECTROSURGICAL) ×2
ELECTRODE REM PT RTRN 9FT ADLT (ELECTROSURGICAL) ×1 IMPLANT
GLOVE BIO SURGEON STRL SZ7.5 (GLOVE) ×2 IMPLANT
GLOVE INDICATOR 8.0 STRL GRN (GLOVE) ×2 IMPLANT
GOWN STRL REUS W/ TWL LRG LVL3 (GOWN DISPOSABLE) ×2 IMPLANT
GOWN STRL REUS W/ TWL XL LVL3 (GOWN DISPOSABLE) ×1 IMPLANT
GOWN STRL REUS W/TWL LRG LVL3 (GOWN DISPOSABLE) ×4
GOWN STRL REUS W/TWL XL LVL3 (GOWN DISPOSABLE) ×2
KIT BASIN OR (CUSTOM PROCEDURE TRAY) ×2 IMPLANT
KIT TURNOVER KIT B (KITS) ×2 IMPLANT
NS IRRIG 1000ML POUR BTL (IV SOLUTION) ×2 IMPLANT
PAD ARMBOARD 7.5X6 YLW CONV (MISCELLANEOUS) ×4 IMPLANT
PENCIL SMOKE EVACUATOR (MISCELLANEOUS) ×2 IMPLANT
POUCH SPECIMEN RETRIEVAL 10MM (ENDOMECHANICALS) ×2 IMPLANT
RELOAD 45 VASCULAR/THIN (ENDOMECHANICALS) IMPLANT
RELOAD STAPLE 45 2.5 WHT GRN (ENDOMECHANICALS) IMPLANT
RELOAD STAPLE 45 3.5 BLU ETS (ENDOMECHANICALS) IMPLANT
RELOAD STAPLE TA45 3.5 REG BLU (ENDOMECHANICALS) ×2 IMPLANT
SCISSORS LAP 5X35 DISP (ENDOMECHANICALS) IMPLANT
SET IRRIG TUBING LAPAROSCOPIC (IRRIGATION / IRRIGATOR) ×2 IMPLANT
SET TUBE SMOKE EVAC HIGH FLOW (TUBING) ×2 IMPLANT
SHEARS HARMONIC ACE PLUS 36CM (ENDOMECHANICALS) ×2 IMPLANT
SLEEVE ENDOPATH XCEL 5M (ENDOMECHANICALS) ×2 IMPLANT
SPECIMEN JAR SMALL (MISCELLANEOUS) ×2 IMPLANT
SUT MNCRL AB 4-0 PS2 18 (SUTURE) ×2 IMPLANT
TOWEL GREEN STERILE (TOWEL DISPOSABLE) ×2 IMPLANT
TOWEL GREEN STERILE FF (TOWEL DISPOSABLE) ×2 IMPLANT
TRAY FOLEY W/BAG SLVR 16FR (SET/KITS/TRAYS/PACK) ×2
TRAY FOLEY W/BAG SLVR 16FR ST (SET/KITS/TRAYS/PACK) ×1 IMPLANT
TRAY LAPAROSCOPIC MC (CUSTOM PROCEDURE TRAY) ×2 IMPLANT
TROCAR XCEL BLUNT TIP 100MML (ENDOMECHANICALS) ×2 IMPLANT
TROCAR XCEL NON-BLD 5MMX100MML (ENDOMECHANICALS) ×2 IMPLANT
WATER STERILE IRR 1000ML POUR (IV SOLUTION) ×2 IMPLANT

## 2019-05-30 NOTE — Anesthesia Procedure Notes (Signed)
Procedure Name: Intubation Date/Time: 05/30/2019 10:15 AM Performed by: Cleda Daub, CRNA Pre-anesthesia Checklist: Patient identified, Emergency Drugs available, Suction available and Patient being monitored Patient Re-evaluated:Patient Re-evaluated prior to induction Oxygen Delivery Method: Circle system utilized Preoxygenation: Pre-oxygenation with 100% oxygen Induction Type: IV induction, Rapid sequence and Cricoid Pressure applied Laryngoscope Size: Miller and 2 Grade View: Grade I Tube type: Oral Tube size: 7.0 mm Number of attempts: 1 Airway Equipment and Method: Stylet Placement Confirmation: ETT inserted through vocal cords under direct vision,  positive ETCO2 and breath sounds checked- equal and bilateral Secured at: 21 cm Tube secured with: Tape Dental Injury: Teeth and Oropharynx as per pre-operative assessment

## 2019-05-30 NOTE — ED Notes (Signed)
Patient transported to CT 

## 2019-05-30 NOTE — H&P (Signed)
Nicole Collins 06/12/1986  [id number].    Chief Complaint/Reason for Consult: acute appendicitis  HPI:  This is a 33 yo white female who began having some periumbilical abdominal pain around 1300 yesterday.  She thought it was gas or reflux at first.  She took some reflux medication, but to no avail.  Throughout the afternoon her pain migrated to the RLQ and she began having N/V and 1 episode of diarrhea.  She denies any fevers, chest pain, SOB, cough.  She presented to the ED where she underwent a work up with a noted leukocytosis and a CT revealing acute appendicitis.  We have been asked to see her.  ROS: ROS: Please see HPI, otherwise negative except for breastfeeding.  Family History  Problem Relation Age of Onset   Diabetes Father    Allergic rhinitis Father    Diabetes Maternal Aunt    Diabetes Paternal Aunt    Diabetes Maternal Grandmother    Diabetes Paternal Grandfather    Asthma Neg Hx     Past Medical History:  Diagnosis Date   Medical history non-contributory     Past Surgical History:  Procedure Laterality Date   CESAREAN SECTION N/A 10/14/2015   Procedure: CESAREAN SECTION;  Surgeon: [name], DO;  Location: [facility];  Service: Obstetrics;  Laterality: N/A;   CESAREAN SECTION N/A 10/13/2018   Procedure: REPEAT CESAREAN SECTION;  Surgeon: Myna Hidalgozan, Jennifer, DO;  Location: [facility];  Service: Obstetrics;  Laterality: N/A;  Heather, RNFA   TONSILLECTOMY      Social History:  reports that she has never smoked. She has never used smokeless tobacco. She reports that she does not drink alcohol or use drugs.  Allergies:  Allergies  Allergen Reactions   Amoxicillin     REACTION: Hives and swelling   Penicillins Rash    Has patient had a PCN reaction causing immediate rash, facial/tongue/throat swelling, SOB or lightheadedness with hypotension: Yes Has patient had a PCN reaction causing severe rash involving mucus membranes or skin  necrosis: Yes Has patient had a PCN reaction that required hospitalization no Has patient had a PCN reaction occurring within the last 10 years: No If all of the above answers are "NO", then may proceed with Cephalosporin use.    (Not in a hospital admission)    Physical Exam: Blood pressure 120/72, pulse 88, temperature 99 F (37.2 C), temperature source Oral, resp. rate 18, SpO2 100 %, unknown if currently breastfeeding. General: pleasant, WD, WN white female who is laying in bed in NAD HEENT: head is normocephalic, atraumatic.  Sclera are noninjected.  PERRL.  Ears and nose without any masses or lesions.  Mouth is pink and moist Heart: regular, rate, and rhythm.  Normal s1,s2. No obvious murmurs, gallops, or rubs noted.  Palpable radial and pedal pulses bilaterally Lungs: CTAB, no wheezes, rhonchi, or rales noted.  Respiratory effort nonlabored Abd: soft, very tender in RLQ at McBurney's point, ND, +BS, no masses, hernias, or organomegaly MS: all 4 extremities are symmetrical with no cyanosis, clubbing, or edema. Skin: warm and dry with no masses, lesions, or rashes Psych: A&Ox3 with an appropriate affect.   Results for orders placed or performed during the hospital encounter of 05/29/19 (from the past 48 hour(s))  Lipase, blood     Status: None   Collection Time: 05/29/19  7:45 PM  Result Value Ref Range   Lipase 31 11 - 51 U/L    Comment: Performed at [facility], [address] [place], [place] [address]  Comprehensive metabolic panel     Status: Abnormal   Collection Time: 05/29/19  7:45 PM  Result Value Ref Range   Sodium 136 135 - 145 mmol/L   Potassium 3.8 3.5 - 5.1 mmol/L   Chloride 102 98 - 111 mmol/L   CO2 20 (L) 22 - 32 mmol/L   Glucose, Bld 117 (H) 70 - 99 mg/dL   BUN 12 6 - 20 mg/dL   Creatinine, Ser 9.140.78 0.44 - 1.00 mg/dL   Calcium 9.5 8.9 - 78.210.3 mg/dL   Total Protein 8.1 6.5 - 8.1 g/dL   Albumin 4.8 3.5 - 5.0 g/dL   AST 21 15 - 41 U/L   ALT 22 0 -  44 U/L   Alkaline Phosphatase 124 38 - 126 U/L   Total Bilirubin 0.6 0.3 - 1.2 mg/dL   GFR calc non Af Amer >60 >60 mL/min   GFR calc Af Amer >60 >60 mL/min   Anion gap 14 5 - 15    Comment: Performed at [facility], [address] [place], [place] [address]  CBC     Status: Abnormal   Collection Time: 05/29/19  7:45 PM  Result Value Ref Range   WBC 17.8 (H) 4.0 - 10.5 K/uL   RBC 4.72 3.87 - 5.11 MIL/uL   Hemoglobin 13.8 12.0 - 15.0 g/dL   HCT 30.842.5 65.736.0 - 84.646.0 %   MCV 90.0 80.0 - 100.0 fL   MCH 29.2 26.0 - 34.0 pg   MCHC 32.5 30.0 - 36.0 g/dL   RDW 96.212.4 95.211.5 - 84.115.5 %   Platelets 302 150 - 400 K/uL   nRBC 0.0 0.0 - 0.2 %    Comment: Performed at [facility], [address] [place], [place] [address]  I-Stat beta hCG blood, ED     Status: None   Collection Time: 05/29/19  8:17 PM  Result Value Ref Range   I-stat hCG, quantitative <5.0 <5 mIU/mL   Comment 3            Comment:   GEST. AGE      CONC.  (mIU/mL)   <=1 WEEK        5 - 50     2 WEEKS       50 - 500     3 WEEKS       100 - 10,000     4 WEEKS     1,000 - 30,000        FEMALE AND NON-PREGNANT FEMALE:     LESS THAN 5 mIU/mL   Urinalysis, Routine w reflex microscopic     Status: Abnormal   Collection Time: 05/29/19  8:40 PM  Result Value Ref Range   Color, Urine YELLOW YELLOW   APPearance CLEAR CLEAR   Specific Gravity, Urine 1.029 1.005 - 1.030   pH 8.0 5.0 - 8.0   Glucose, UA NEGATIVE NEGATIVE mg/dL   Hgb urine dipstick NEGATIVE NEGATIVE   Bilirubin Urine NEGATIVE NEGATIVE   Ketones, ur 80 (A) NEGATIVE mg/dL   Protein, ur 30 (A) NEGATIVE mg/dL   Nitrite NEGATIVE NEGATIVE   Leukocytes,Ua NEGATIVE NEGATIVE   RBC / HPF 0-5 0 - 5 RBC/hpf   WBC, UA 0-5 0 - 5 WBC/hpf   Bacteria, UA NONE SEEN NONE SEEN   Squamous Epithelial / LPF 0-5 0 - 5   Mucus PRESENT     Comment: Performed at [facility], [place] [address], [place], [place] [address]   Ct Abdomen Pelvis W Contrast  Result Date:  05/30/2019 CLINICAL DATA:  Right lower quadrant abdominal pain with nausea and vomiting since yesterday afternoon. EXAM: CT ABDOMEN AND PELVIS WITH CONTRAST TECHNIQUE: Multidetector CT imaging of the abdomen and pelvis was performed using the standard protocol following bolus administration of intravenous contrast. CONTRAST:  122mL OMNIPAQUE IOHEXOL 300 MG/ML  SOLN COMPARISON:  01/06/2019 CT abdomen/pelvis. FINDINGS: Lower chest: No significant pulmonary nodules or acute consolidative airspace disease. Hepatobiliary: Normal liver size. No liver mass. Normal gallbladder with no radiopaque cholelithiasis. No biliary ductal dilatation. Pancreas: Normal, with no mass or duct dilation. Spleen: Normal size. No mass. Adrenals/Urinary Tract: Normal adrenals. Normal kidneys with no hydronephrosis and no renal mass. Normal bladder. Stomach/Bowel: Normal non-distended stomach. Normal caliber small bowel with no small bowel wall thickening. Appendix is dilated and thick-walled with periappendiceal fat stranding, compatible with acute appendicitis. Appendix: Location: Right lower quadrant Diameter: 14 mm Appendicolith: Not present Mucosal hyper-enhancement: Present Extraluminal gas: Not present Periappendiceal collection: Not present Normal large bowel with no diverticulosis, large bowel wall thickening or pericolonic fat stranding. Vascular/Lymphatic: Normal caliber abdominal aorta. Patent portal, splenic, hepatic and renal veins. No pathologically enlarged lymph nodes in the abdomen or pelvis. Reproductive: Grossly normal uterus.  No adnexal mass. Other: No pneumoperitoneum, ascites or focal fluid collection. Musculoskeletal: No aggressive appearing focal osseous lesions. IMPRESSION: Acute appendicitis.  No free air.  No abscess. Electronically Signed   By: Ilona Sorrel M.D.   On: 05/30/2019 06:51      Assessment/Plan Breastfeeding  Acute appendicitis The patient has acute appendicitis.  We will admit her for lap appy.   We have discussed that her COVID test is pending and if this were to be positive we would then defer an operation to conservative management with abx therapy given the increase in mortality.  She understands this.  Otherwise I have discussed the procedure, risks, and aftercare of appendectomy. The risks include but are not limited to bleeding, infection, wound problems, anesthesia, injury to intra-abdominal organs, possibility of postoperative ileus. She seems to understand and agrees with the plan.  She is breastfeeding.  All questions were answered.  FEN - NPO for OR VTE - hold til after OR ID - Rocephin/Flagyl  Henreitta Cea, [facility] 05/30/2019, 8:07 AM Pager: [phone]

## 2019-05-30 NOTE — ED Notes (Signed)
Report given to Robbie, RN in short-stay. 

## 2019-05-30 NOTE — Anesthesia Postprocedure Evaluation (Signed)
Anesthesia Post Note  Patient: Nicole Collins  Procedure(s) Performed: APPENDECTOMY LAPAROSCOPIC (N/A Abdomen)     Patient location during evaluation: PACU Anesthesia Type: General Level of consciousness: sedated Pain management: pain level controlled Vital Signs Assessment: post-procedure vital signs reviewed and stable Respiratory status: spontaneous breathing and respiratory function stable Cardiovascular status: stable Postop Assessment: no apparent nausea or vomiting Anesthetic complications: no    Last Vitals:  Vitals:   05/30/19 1150 05/30/19 1155  BP: 111/64 108/62  Pulse: 76 66  Resp: 18 15  Temp:    SpO2: 99% 99%    Last Pain:  Vitals:   05/30/19 1155  TempSrc:   PainSc: 5                  Ayush Boulet DANIEL

## 2019-05-30 NOTE — Op Note (Signed)
Nicole Collins [id number]   PRE-OPERATIVE DIAGNOSIS:  Acute appendicitis  POST-OPERATIVE DIAGNOSIS:  Acute appendicitis without perforation or gangrene  Procedure(s): APPENDECTOMY LAPAROSCOPIC  PROCEDURE: Laparoscopic appendectomy  SURGEON:  Nicole Collins, M.D.  ASSISTANT: OR staff  ANESTHESIA: General endotracheal  EBL:   2 mL  DRAINS: None  SPECIMEN:  Appendix  COUNTS:  Sponge, needle and instrument counts were reported correct x2 at conclusion of the operation  DISPOSITION:  PACU in satisfactory condition  COMPLICATIONS: None  FINDINGS: Omental low midline adhesions where were taken down to facilite port placement and safe instrument exchange. Acutely inflamed appendix tip and body with viable base. No purulence or evidence of perforation. Terminal ileum, cecum and proximal ascending colon were normal in appearance.  INDICATIONS: Ms. Nicole Collins is a very pleasant 32yoF whom presented to the ED today with acute appendicitis. She was seen and evaluated. She had RLQ tenderness, leukocytosis and CT findings confirming acutely inflamed appearing appendix. Please refer to notes elsewhere for details regarding our discussions. She opted to pursue surgery.  DESCRIPTION:  The patient was identified & brought into the operating room. SCDs were in place and functioning. General endotracheal anesthesia was administered. Preoperative antibiotics had been administered. The patient was positioned supine with left arm tucked. Hair on the abdomen was then clipped by the OR team. A foley catheter was inserted under sterile conditions. The abdomen was prepped and draped in the standard sterile fashion. A surgical timeout was performed and confirmed our plan.  A small incision was made in the supraumbilical fold given her prior c-sections. The subcutaneous tissue was dissected and the umbilical stalk identified. The stalk was grasped with a Kocher and retracted outwardly. The supraumbilical  fascia was exposed and incised. Peritoneal entry was carefully made bluntly. A 0 Vicryl purse-string suture was placed and then the [facility] was introduced into the abdomen.  CO2 insufflation commenced to 15mmHg. The laparoscope was inserted and confirmed no evidence of trocar site complications. The patient was then positioned in Trendelenburg. Omental adhesions were noted along the low midline. A 5mm port was placed in the left hemiabdomen. The omental adhesions were then carefully taken down sharply. Hemostasis was then verified in the omentum. One additional port was placed - in the suprapubic midline taking care to stay well above the bladder - 3 fingerbreadths above the pubic symphysis. The bed was then slightly tilted to place the left side down.  The terminal ileum was identified and swept out of the pelvis. This was well away from the appendix. The appendix was identified and attachments to the appendix to the surrounding tissues were freed without difficulty.  The appendix was elevated.  The base of the appendix was circumferentially dissected taking care to preserve the cecum free of injury. The base was noted to be viable and healthy appearing. The terminal ileum, cecum and ascending colon also appeared normal. The base of the appendix was then stapled with a blue load, taking a small healthy cuff of viable cecum, taking care to stay clear of the ileocecal valve. The mesoappendix was then ligated by "hugging" the appendix using the harmonic scalpel. The mesoappendix was inspected and noted to be hemostatic. The appendix was placed in an EndoBag.  The right lower quadrant was conservatively irrigated. Hemostasis was noted to be achieved - taking time to inspect the ligated mesoappendix, colon mesentery, and retroperitoneum. Staple line was noted to be intact on the cecum with no bleeding. There was no perforation or injury. The right lower  quadrant appeared clean and as such, no drain was  placed.  The left lower quadrant and suprapubic ports were removed under direct visualization. The EndoBag was then removed through the umbilical port site and appendix passed off as specimen. The CO2 was exhausted from the abdomen. The umbilical fascia was then closed by closing the 0 Vicryl suture. The fascia was palpated and noted to be completely closed. The skin of all port sites was then approximated using 4-0 Monocryl suture. The incisions were covered with Dermabond.  She was then awakened from general anesthesia, extubated, and transferred to a stretcher for transport to recover in satisfactory condition.

## 2019-05-30 NOTE — Transfer of Care (Signed)
Immediate Anesthesia Transfer of Care Note  Patient: Nicole Collins  Procedure(s) Performed: APPENDECTOMY LAPAROSCOPIC (N/A Abdomen)  Patient Location: PACU  Anesthesia Type:General  Level of Consciousness: awake, alert , oriented and patient cooperative  Airway & Oxygen Therapy: Patient Spontanous Breathing and Patient connected to face mask oxygen  Post-op Assessment: Report given to RN and Post -op Vital signs reviewed and stable  Post vital signs: Reviewed and stable  Last Vitals:  Vitals Value Taken Time  BP 117/64 05/30/19 1126  Temp    Pulse 80 05/30/19 1132  Resp 19 05/30/19 1132  SpO2 99 % 05/30/19 1132  Vitals shown include unvalidated device data.  Last Pain:  Vitals:   05/30/19 0644  TempSrc:   PainSc: 6          Complications: No apparent anesthesia complications

## 2019-05-30 NOTE — Anesthesia Preprocedure Evaluation (Addendum)
Anesthesia Evaluation  Patient identified by MRN, date of birth, ID band Patient awake    Reviewed: Allergy & Precautions, Patient's Chart, lab work & pertinent test results  History of Anesthesia Complications (+) PONVNegative for: history of anesthetic complications  Airway Mallampati: II  TM Distance: >3 FB Neck ROM: Full    Dental no notable dental hx. (+) Dental Advisory Given   Pulmonary asthma ,    Pulmonary exam normal        Cardiovascular negative cardio ROS Normal cardiovascular exam     Neuro/Psych negative neurological ROS  negative psych ROS   GI/Hepatic negative GI ROS, Neg liver ROS,   Endo/Other  obesity  Renal/GU negative Renal ROS  negative genitourinary   Musculoskeletal negative musculoskeletal ROS (+)   Abdominal (+) - obese,   Peds negative pediatric ROS (+)  Hematology negative hematology ROS (+)   Anesthesia Other Findings   Reproductive/Obstetrics negative OB ROS                           Anesthesia Physical  Anesthesia Plan  ASA: II  Anesthesia Plan: General   Post-op Pain Management:    Induction: Intravenous, Rapid sequence and Cricoid pressure planned  PONV Risk Score and Plan: 4 or greater and Ondansetron, Scopolamine patch - Pre-op, Midazolam and Propofol infusion  Airway Management Planned: Oral ETT  Additional Equipment:   Intra-op Plan:   Post-operative Plan: Extubation in OR  Informed Consent: I have reviewed the patients History and Physical, chart, labs and discussed the procedure including the risks, benefits and alternatives for the proposed anesthesia with the patient or authorized representative who has indicated his/her understanding and acceptance.     Dental advisory given  Plan Discussed with: CRNA and Anesthesiologist  Anesthesia Plan Comments:      Anesthesia Quick Evaluation

## 2019-05-30 NOTE — ED Provider Notes (Signed)
[facility] Provider Note   CSN: [id number] Arrival date & time: 05/29/19  1924    History   Chief Complaint Chief Complaint  Patient presents with  . Emesis    HPI Nicole Collins is a 33 y.o. female.   The history is provided by the patient.  Emesis She had onset about 1 PM of nausea, vomiting, abdominal pain.  Abdominal pain was periumbilical and radiating to the right lower quadrant and also to her back.  She has been unable to hold anything down since then.  Pain is rated at 9/10.  Nothing makes the pain better, nothing makes it worse.  She denies fever, chills, sweats.  Symptoms started after eating sausage.  She had a similar episode about 4 months ago.  She is 8 months postpartum.  Past Medical History:  Diagnosis Date  . Medical history non-contributory     Patient Active Problem List   Diagnosis Date Noted  . Moderate persistent asthma without complication 12/18/2018  . Other allergic rhinitis 12/18/2018  . Drug reaction 12/18/2018  . Normal intrauterine pregnancy in third trimester 10/13/2018  . Intrauterine pregnancy 10/11/2018  . Intrauterine normal pregnancy 10/14/2015  . DYSPNEA 06/01/2009    Past Surgical History:  Procedure Laterality Date  . CESAREAN SECTION N/A 10/14/2015   Procedure: CESAREAN SECTION;  Surgeon: [name], DO;  Location: [facility] ORS;  Service: Obstetrics;  Laterality: N/A;  . CESAREAN SECTION N/A 10/13/2018   Procedure: REPEAT CESAREAN SECTION;  Surgeon: Myna Hidalgozan, Jennifer, DO;  Location: [facility] BIRTHING SU[facility];  Service: Obstetrics;  Laterality: N/A;  Heather, RNFA  . TONSILLECTOMY       OB History    Gravida  2   Para  2   Term  2   Preterm  0   AB  0   Living  2     SAB  0   TAB  0   Ectopic  0   Multiple  0   Live Births  2            Home Medications    Prior to Admission medications   Medication Sig Start Date End Date Taking? Authorizing Provider   budesonide-formoterol (SYMBICORT) 160-4.5 MCG/ACT inhaler Inhale 2 puffs into the lungs 2 (two) times daily. 12/18/18   Ellamae SiaKim, Yoon M, DO  ibuprofen (ADVIL,MOTRIN) 600 MG tablet Take 1 tablet (600 mg total) by mouth every 6 (six) hours as needed for moderate pain. 10/15/18   Janeece RiggersGreer, Ellis K, CNM  ondansetron (ZOFRAN ODT) 4 MG disintegrating tablet Take 1 tablet (4 mg total) by mouth every 8 (eight) hours as needed for nausea or vomiting. 01/06/19   Antony MaduraHumes, Kelly, PA-C  Prenatal Vit-Fe Fumarate-FA (PRENATAL MULTIVITAMIN) TABS tablet Take 1 tablet by mouth at bedtime.     [provider]  PROAIR HFA 108 [phone](90 Base) MCG/ACT inhaler Inhale 1-2 puffs into the lungs every 6 (six) hours as needed for wheezing or shortness of breath.  11/12/18   [provider]  ranitidine (ZANTAC) 150 MG tablet Take 1 tablet (150 mg total) by mouth 2 (two) times daily. 01/06/19   Antony MaduraHumes, Kelly, PA-C  sertraline (ZOLOFT) 50 MG tablet Take 50 mg by mouth daily. 11/26/18   [provider]    Family History Family History  Problem Relation Age of Onset  . Diabetes Father   . Allergic rhinitis Father   . Diabetes Maternal Aunt   . Diabetes Paternal Aunt   . Diabetes Maternal  Grandmother   . Diabetes Paternal Grandfather   . Asthma Neg Hx     Social History Social History   Tobacco Use  . Smoking status: Never Smoker  . Smokeless tobacco: Never Used  Substance Use Topics  . Alcohol use: No  . Drug use: No     Allergies   Amoxicillin and Penicillins   Review of Systems Review of Systems  Gastrointestinal: Positive for vomiting.  All other systems reviewed and are negative.    Physical Exam Updated Vital Signs BP 131/74   Pulse 75   Temp 99 F (37.2 C) (Oral)   Resp 18   SpO2 100%   Physical Exam Vitals signs and nursing note reviewed.    33 year old female, appears uncomfortable, but is in no acute distress. Vital signs are normal. Oxygen saturation is 100%, which is normal.  Head is normocephalic and atraumatic. PERRLA, EOMI. Oropharynx is clear. Neck is nontender and supple without adenopathy or JVD. Back is nontender and there is no CVA tenderness. Lungs are clear without rales, wheezes, or rhonchi. Chest is nontender. Heart has regular rate and rhythm without murmur. Abdomen is soft, flat, with marked tenderness in the right upper abdomen, right mid abdomen, right lower abdomen.  There is no rebound or guarding.  There are no masses or hepatosplenomegaly and peristalsis is hypoactive. Extremities have no cyanosis or edema, full range of motion is present. Skin is warm and dry without rash. Neurologic: Mental status is normal, cranial nerves are intact, there are no motor or sensory deficits.  ED Treatments / Results  Labs (all labs ordered are listed, but only abnormal results are displayed) Labs Reviewed  COMPREHENSIVE METABOLIC PANEL - Abnormal; Notable for the following components:      Result Value   CO2 20 (*)    Glucose, Bld 117 (*)    All other components within normal limits  CBC - Abnormal; Notable for the following components:   WBC 17.8 (*)    All other components within normal limits  URINALYSIS, ROUTINE W REFLEX MICROSCOPIC - Abnormal; Notable for the following components:   Ketones, ur 80 (*)    Protein, ur 30 (*)    All other components within normal limits  LIPASE, BLOOD  I-STAT BETA HCG BLOOD, ED ([facility], [facility], [facility] ONLY)    EKG EKG Interpretation  Date/Time:  Friday May 29 2019 19:41:37 EDT Ventricular Rate:  75 PR Interval:  148 QRS Duration: 86 QT Interval:  404 QTC Calculation: 451 R Axis:   -13 Text Interpretation:  Normal sinus rhythm with sinus arrhythmia Normal ECG When compared with ECG of 10/23/2018, No significant change was found Confirmed by Delora Fuel ([id number]) on 05/29/2019 11:00:02 PM   Radiology No results found.  Procedures Procedures  Medications Ordered in ED Medications  sodium chloride flush (NS) 0.9 %  injection 3 mL (has no administration in time range)  ondansetron (ZOFRAN-ODT) disintegrating tablet 4 mg (4 mg Oral Given 05/29/19 1937)     Initial Impression / Assessment and Plan / ED Course  I have reviewed the triage vital signs and the nursing notes.  Pertinent labs & imaging results that were available during my care of the patient were reviewed by me and considered in my medical decision making (see chart for details).  Abdominal pain with vomiting.  Old records are reviewed confirming ED visit in March which time ultrasound and CT scan of abdomen were unremarkable.  I still have a strong suspicion that this  is biliary colic, she is likely to need a HIDA scan as an outpatient.  Labs today do show significant leukocytosis, and urine shows ketones consistent with emesis.  She will be given IV fluids, morphine, ondansetron.  With fairly recent CT scan, we will try to avoid additional ionizing radiation.  After above noted treatment she was resting comfortably but still had severe right sided tenderness.  Unfortunately, I feel she will need to have a CT scan to rule out appendicitis.  CT shows evidence of appendicitis without abscess or perforation.  She is started on antibiotics.  Case is discussed with Dr. Cliffton AstersWhite of general surgery service who agrees to admit the patient for appendectomy.  Final Clinical Impressions(s) / ED Diagnoses   Final diagnoses:  Acute appendicitis, unspecified acute appendicitis type    ED Discharge Orders    None       Dione BoozeGlick, Markanthony Gedney, MD 05/30/19 [phone]

## 2019-05-31 ENCOUNTER — Encounter (HOSPITAL_COMMUNITY): Payer: Self-pay | Admitting: Surgery

## 2019-05-31 DIAGNOSIS — E669 Obesity, unspecified: Secondary | ICD-10-CM | POA: Diagnosis not present

## 2019-05-31 DIAGNOSIS — J45909 Unspecified asthma, uncomplicated: Secondary | ICD-10-CM | POA: Diagnosis not present

## 2019-05-31 DIAGNOSIS — Z20828 Contact with and (suspected) exposure to other viral communicable diseases: Secondary | ICD-10-CM | POA: Diagnosis not present

## 2019-05-31 DIAGNOSIS — K3580 Unspecified acute appendicitis: Secondary | ICD-10-CM | POA: Diagnosis not present

## 2019-05-31 LAB — CBC
HCT: 33.7 % — ABNORMAL LOW (ref 36.0–46.0)
Hemoglobin: 10.6 g/dL — ABNORMAL LOW (ref 12.0–15.0)
MCH: 29.4 pg (ref 26.0–34.0)
MCHC: 31.5 g/dL (ref 30.0–36.0)
MCV: 93.6 fL (ref 80.0–100.0)
Platelets: 220 10*3/uL (ref 150–400)
RBC: 3.6 MIL/uL — ABNORMAL LOW (ref 3.87–5.11)
RDW: 12.5 % (ref 11.5–15.5)
WBC: 9.2 10*3/uL (ref 4.0–10.5)
nRBC: 0 % (ref 0.0–0.2)

## 2019-05-31 MED ORDER — ACETAMINOPHEN 500 MG PO TABS: 1000.0000 mg | ORAL_TABLET | Freq: Four times a day (QID) | ORAL | 0 refills | Status: AC

## 2019-05-31 MED ORDER — TRAMADOL HCL 50 MG PO TABS: 50.0000 mg | ORAL_TABLET | Freq: Four times a day (QID) | ORAL | 0 refills | Status: DC | PRN

## 2019-05-31 NOTE — Progress Notes (Signed)
Discharged Pt to home. Instructions given and explained. Belongings returned accordingly. 

## 2019-05-31 NOTE — Discharge Instructions (Signed)
[facility]  Please arrive at least 30 min before your appointment to complete your check in paperwork.  If you are unable to arrive 30 min prior to your appointment time we may have to cancel or reschedule you. LAPAROSCOPIC SURGERY: POST OP INSTRUCTIONS Always review your discharge instruction sheet given to you by the facility where your surgery was performed. IF YOU HAVE DISABILITY OR FAMILY LEAVE FORMS, YOU MUST BRING THEM TO THE OFFICE FOR PROCESSING.   DO NOT GIVE THEM TO YOUR DOCTOR.  PAIN CONTROL  1. First take acetaminophen (Tylenol) AND/or ibuprofen (Advil) to control your pain after surgery.  Follow directions on package.  Taking acetaminophen (Tylenol) and/or ibuprofen (Advil) regularly after surgery will help to control your pain and lower the amount of prescription pain medication you may need.  You should not take more than 4,000 mg (4 grams) of acetaminophen (Tylenol) in 24 hours.  You should not take ibuprofen (Advil), aleve, motrin, naprosyn or other NSAIDS if you have a history of stomach ulcers or chronic kidney disease.  2. A prescription for pain medication may be given to you upon discharge.  Take your pain medication as prescribed, if you still have uncontrolled pain after taking acetaminophen (Tylenol) or ibuprofen (Advil). 3. Use ice packs to help control pain. 4. If you need a refill on your pain medication, please contact your pharmacy.  They will contact our office to request authorization. Prescriptions will not be filled after 5pm or on week-ends.  HOME MEDICATIONS 5. Take your usually prescribed medications unless otherwise directed.  DIET 6. You should follow a light diet the first few days after arrival home.  Be sure to include lots of fluids daily. Avoid fatty, fried foods.   CONSTIPATION 7. It is common to experience some constipation after surgery and if you are taking pain medication.  Increasing fluid intake and taking a stool  softener (such as Colace) will usually help or prevent this problem from occurring.  A mild laxative (Milk of Magnesia or Miralax) should be taken according to package instructions if there are no bowel movements after 48 hours.  WOUND/INCISION CARE 8. Most patients will experience some swelling and bruising in the area of the incisions.  Ice packs will help.  Swelling and bruising can take several days to resolve.  9. Unless discharge instructions indicate otherwise, follow guidelines below  a. STERI-STRIPS - you may remove your outer bandages 48 hours after surgery, and you may shower at that time.  You have steri-strips (small skin tapes) in place directly over the incision.  These strips should be left on the skin for 7-10 days.   b. DERMABOND/SKIN GLUE - you may shower in 24 hours.  The glue will flake off over the next 2-3 weeks. 10. Any sutures or staples will be removed at the office during your follow-up visit.  ACTIVITIES 11. You may resume regular (light) daily activities beginning the next day--such as daily self-care, walking, climbing stairs--gradually increasing activities as tolerated.  You may have sexual intercourse when it is comfortable.  Refrain from any heavy lifting or straining until approved by your doctor. a. You may drive when you are no longer taking prescription pain medication, you can comfortably wear a seatbelt, and you can safely maneuver your car and apply brakes.  FOLLOW-UP 12. You should see your doctor in the office for a follow-up appointment approximately 2-3 weeks after your surgery.  You should have been given your post-op/follow-up appointment when   your surgery was scheduled.  If you did not receive a post-op/follow-up appointment, make sure that you call for this appointment within a day or two after you arrive home to insure a convenient appointment time.   WHEN TO CALL YOUR DOCTOR: 1. Fever over 101.0 2. Inability to urinate 3. Continued bleeding from  incision. 4. Increased pain, redness, or drainage from the incision. 5. Increasing abdominal pain  The clinic staff is available to answer your questions during regular business hours.  Please don't hesitate to call and ask to speak to one of the nurses for clinical concerns.  If you have a medical emergency, go to the nearest emergency room or call 911.  A surgeon from [facility] is always on call at the hospital. [address], [address], [place], [place]  [address] ? [address] [address], [place], [place]   [address] [phone] ? [phone] ? FAX [phone]  .........   Managing Your Pain After Surgery Without Opioids    Thank you for participating in our program to help patients manage their pain after surgery without opioids. This is part of our effort to provide you with the best care possible, without exposing you or your family to the risk that opioids pose.  What pain can I expect after surgery? You can expect to have some pain after surgery. This is normal. The pain is typically worse the day after surgery, and quickly begins to get better. Many studies have found that many patients are able to manage their pain after surgery with Over-the-Counter (OTC) medications such as Tylenol and Motrin. If you have a condition that does not allow you to take Tylenol or Motrin, notify your surgical team.  How will I manage my pain? The best strategy for controlling your pain after surgery is around the clock pain control with Tylenol (acetaminophen) and Motrin (ibuprofen or Advil). Alternating these medications with each other allows you to maximize your pain control. In addition to Tylenol and Motrin, you can use heating pads or ice packs on your incisions to help reduce your pain.  How will I alternate your regular strength over-the-counter pain medication? You will take a dose of pain medication every three hours. ; Start by taking 650 mg of Tylenol (2 pills of 325  mg) ; 3 hours later take 600 mg of Motrin (3 pills of 200 mg) ; 3 hours after taking the Motrin take 650 mg of Tylenol ; 3 hours after that take 600 mg of Motrin.   - 1 -  See example - if your first dose of Tylenol is at 12:00 PM   12:00 PM Tylenol 650 mg (2 pills of 325 mg)  3:00 PM Motrin 600 mg (3 pills of 200 mg)  6:00 PM Tylenol 650 mg (2 pills of 325 mg)  9:00 PM Motrin 600 mg (3 pills of 200 mg)  Continue alternating every 3 hours   We recommend that you follow this schedule around-the-clock for at least 3 days after surgery, or until you feel that it is no longer needed. Use the table on the last page of this handout to keep track of the medications you are taking. Important: Do not take more than 3000mg of Tylenol or 3200mg of Motrin in a 24-hour period. Do not take ibuprofen/Motrin if you have a history of bleeding stomach ulcers, severe kidney disease, &/or actively taking a blood thinner  What if I still have pain? If you have pain that is not   controlled with the over-the-counter pain medications (Tylenol and Motrin or Advil) you might have what we call "breakthrough" pain. You will receive a prescription for a small amount of an opioid pain medication such as Oxycodone, Tramadol, or Tylenol with Codeine. Use these opioid pills in the first 24 hours after surgery if you have breakthrough pain. Do not take more than 1 pill every 4-6 hours.  If you still have uncontrolled pain after using all opioid pills, don't hesitate to call our staff using the number provided. We will help make sure you are managing your pain in the best way possible, and if necessary, we can provide a prescription for additional pain medication.   Day 1    Time  Name of Medication Number of pills taken  Amount of Acetaminophen  Pain Level   Comments  AM PM       AM PM       AM PM       AM PM       AM PM       AM PM       AM PM       AM PM       Total Daily amount of Acetaminophen Do not  take more than  3,000 mg per day      Day 2    Time  Name of Medication Number of pills taken  Amount of Acetaminophen  Pain Level   Comments  AM PM       AM PM       AM PM       AM PM       AM PM       AM PM       AM PM       AM PM       Total Daily amount of Acetaminophen Do not take more than  3,000 mg per day      Day 3    Time  Name of Medication Number of pills taken  Amount of Acetaminophen  Pain Level   Comments  AM PM       AM PM       AM PM       AM PM          AM PM       AM PM       AM PM       AM PM       Total Daily amount of Acetaminophen Do not take more than  3,000 mg per day      Day 4    Time  Name of Medication Number of pills taken  Amount of Acetaminophen  Pain Level   Comments  AM PM       AM PM       AM PM       AM PM       AM PM       AM PM       AM PM       AM PM       Total Daily amount of Acetaminophen Do not take more than  3,000 mg per day      Day 5    Time  Name of Medication Number of pills taken  Amount of Acetaminophen  Pain Level   Comments  AM PM       AM PM       AM   PM       AM PM       AM PM       AM PM       AM PM       AM PM       Total Daily amount of Acetaminophen Do not take more than  3,000 mg per day       Day 6    Time  Name of Medication Number of pills taken  Amount of Acetaminophen  Pain Level  Comments  AM PM       AM PM       AM PM       AM PM       AM PM       AM PM       AM PM       AM PM       Total Daily amount of Acetaminophen Do not take more than  3,000 mg per day      Day 7    Time  Name of Medication Number of pills taken  Amount of Acetaminophen  Pain Level   Comments  AM PM       AM PM       AM PM       AM PM       AM PM       AM PM       AM PM       AM PM       Total Daily amount of Acetaminophen Do not take more than  3,000 mg per day        For additional information about how and where to safely dispose of unused  opioid medications - https://www.morepowerfulnc.org  Disclaimer: This document contains information and/or instructional materials adapted from [facility] for the typical patient with your condition. It does not replace medical advice from your health care provider because your experience may differ from that of the typical patient. Talk to your health care provider if you have any questions about this document, your condition or your treatment plan. Adapted from [facility]   

## 2019-05-31 NOTE — Discharge Summary (Signed)
Patient ID: Nicole Collins [id number] 09/12/1986 33 y.o.  Admit date: 05/29/2019 Discharge date: 05/31/2019  Admitting Diagnosis: Acute appendicitis  Discharge Diagnosis Patient Active Problem List   Diagnosis Date Noted  . Acute appendicitis 05/30/2019  . S/P laparoscopic appendectomy 05/30/2019  . Moderate persistent asthma without complication 12/18/2018  . Other allergic rhinitis 12/18/2018  . Drug reaction 12/18/2018  . Normal intrauterine pregnancy in third trimester 10/13/2018  . Intrauterine pregnancy 10/11/2018  . Intrauterine normal pregnancy 10/14/2015  . DYSPNEA 06/01/2009    Consultants none  Reason for Admission: This is a 33 yo white female who began having some periumbilical abdominal pain around 1300 yesterday.  She thought it was gas or reflux at first.  She took some reflux medication, but to no avail.  Throughout the afternoon her pain migrated to the RLQ and she began having N/V and 1 episode of diarrhea.  She denies any fevers, chest pain, SOB, cough.  She presented to the ED where she underwent a work up with a noted leukocytosis and a CT revealing acute appendicitis.  We have been asked to see her.  Procedures Lap appy, Dr. Cliffton AstersWhite 05/30/19  Hospital Course:  The patient was admitted and underwent a laparoscopic appendectomy.  The patient tolerated the procedure well.  On POD 1, the patient was tolerating a regular diet, voiding well, mobilizing, and pain was controlled with oral pain medications.  The patient was stable for DC home at this time with appropriate follow up made.     Physical Exam: Abd: soft, appropriately tender, incisions c/d/i, +BS  Allergies as of 05/31/2019      Reactions   Amoxicillin    REACTION: Hives and swelling   Penicillins Rash   Has patient had a PCN reaction causing immediate rash, facial/tongue/throat swelling, SOB or lightheadedness with hypotension: Yes Has patient had a PCN reaction causing severe rash involving  mucus membranes or skin necrosis: Yes Has patient had a PCN reaction that required hospitalization no Has patient had a PCN reaction occurring within the last 10 years: No If all of the above answers are "NO", then may proceed with Cephalosporin use.      Medication List    TAKE these medications   acetaminophen 500 MG tablet Commonly known as: TYLENOL Take 2 tablets (1,000 mg total) by mouth every 6 (six) hours.   budesonide-formoterol 160-4.5 MCG/ACT inhaler Commonly known as: Symbicort Inhale 2 puffs into the lungs 2 (two) times daily.   ibuprofen 600 MG tablet Commonly known as: ADVIL Take 1 tablet (600 mg total) by mouth every 6 (six) hours as needed for moderate pain.   ondansetron 4 MG disintegrating tablet Commonly known as: Zofran ODT Take 1 tablet (4 mg total) by mouth every 8 (eight) hours as needed for nausea or vomiting.   prenatal multivitamin Tabs tablet Take 1 tablet by mouth at bedtime.   ProAir HFA 108 (90 Base) MCG/ACT inhaler Generic drug: albuterol Inhale 1-2 puffs into the lungs every 6 (six) hours as needed for wheezing or shortness of breath.   ranitidine 150 MG tablet Commonly known as: ZANTAC Take 1 tablet (150 mg total) by mouth 2 (two) times daily.   traMADol 50 MG tablet Commonly known as: ULTRAM Take 1 tablet (50 mg total) by mouth every 6 (six) hours as needed (pain not controlled with tylenol and ibuprofen).        Follow-up Information    [facility] up in 3 week(s).  Specialty: General Surgery Why: our office will call you with appointment date and time Contact information: [place] [address] [phone]           Signed: Saverio Danker, [facility] 05/31/2019, 9:14 AM Pager: [phone]

## 2019-11-16 ENCOUNTER — Other Ambulatory Visit: Payer: Self-pay | Admitting: *Deleted

## 2020-03-07 ENCOUNTER — Other Ambulatory Visit: Payer: Self-pay | Admitting: Allergy

## 2020-04-08 ENCOUNTER — Other Ambulatory Visit: Payer: Self-pay | Admitting: Allergy

## 2020-05-26 NOTE — Patient Instructions (Addendum)
Moderate persistent asthma Stop generic Symbicort Start AirDuo 232/14- using one puff twice a day to help prevent cough and wheeze. Start prednisone 10 mg-take 2 tablets in the morning and in the evening for 3 days, then on the fourth day take 2 tablets in the morning and on the fifth day take 1 tablet and stop. Start Singulair 10 mg once a day to help prevent cough and wheeze. Patient cautioned that rarely some children/adults can experience behavioral changes after beginning montelukast. These side effects are rare, however, if you notice any change, notify the clinic and discontinue montelukast. Continue albuterol 2 puffs every 4 hours as needed for cough, wheeze, tightness in chest, or shortness of breath. Asthma control goals:   Full participation in all desired activities (may need albuterol before activity)  Albuterol use two time or less a week on average (not counting use with activity)  Cough interfering with sleep two time or less a month  Oral steroids no more than once a year  No hospitalizations  Allergic rhinitis ( skin test positive on 12/25/2018 to: dust mite, cat, dog, horse, borderline to mold) Continue Claritin 10 mg once a day Continue saline rinse as needed  Drug reaction (broke out in hives as a child after amoxicillin) Continue to avoid. Consider testing in the future  Please let us know if this treatment plan is not working well for you. Schedule follow up appointment in 2 months

## 2020-05-27 ENCOUNTER — Ambulatory Visit: Payer: Commercial Managed Care - PPO | Admitting: Family

## 2020-05-27 ENCOUNTER — Encounter: Payer: Self-pay | Admitting: Family

## 2020-05-27 ENCOUNTER — Other Ambulatory Visit: Payer: Self-pay

## 2020-05-27 VITALS — BP 120/68 | HR 72 | Temp 98.7°F | Resp 16 | Ht 60.0 in | Wt 172.2 lb

## 2020-05-27 DIAGNOSIS — T50905D Adverse effect of unspecified drugs, medicaments and biological substances, subsequent encounter: Secondary | ICD-10-CM | POA: Diagnosis not present

## 2020-05-27 DIAGNOSIS — J4541 Moderate persistent asthma with (acute) exacerbation: Secondary | ICD-10-CM | POA: Diagnosis not present

## 2020-05-27 DIAGNOSIS — J3089 Other allergic rhinitis: Secondary | ICD-10-CM | POA: Diagnosis not present

## 2020-05-27 MED ORDER — AIRDUO DIGIHALER 232-14 MCG/ACT IN AEPB: 1.0000 | INHALATION_SPRAY | Freq: Two times a day (BID) | RESPIRATORY_TRACT | 5 refills | Status: DC

## 2020-05-27 MED ORDER — MONTELUKAST SODIUM 10 MG PO TABS
10.0000 mg | ORAL_TABLET | Freq: Every day | ORAL | 5 refills | Status: DC
Start: 2020-05-27 — End: 2020-08-29

## 2020-05-27 MED ORDER — ALBUTEROL SULFATE HFA 108 (90 BASE) MCG/ACT IN AERS
2.0000 | INHALATION_SPRAY | Freq: Four times a day (QID) | RESPIRATORY_TRACT | 1 refills | Status: DC | PRN
Start: 2020-05-27 — End: 2020-07-27

## 2020-05-27 NOTE — Progress Notes (Signed)
[address] Debbora Presto [place] [address] Dept: [phone]  FOLLOW UP NOTE  Patient ID: Nicole Collins, female    DOB: 08/11/1986  Age: 34 y.o. MRN: [id number] Date of Office Visit: 05/27/2020  Assessment  Chief Complaint: Follow-up  HPI Nicole Collins is a 34 year old female who presents for follow-up of moderate persistent asthma and allergic rhinitis.  She was last seen on December 25, 2018 by Dr. Selena Batten.  Moderate persistent asthma is reported as not well controlled with the use of generic Symbicort 160/4.5-2 puffs twice a day and albuterol as needed.  She reports a dry cough that does not occur daily, wheezing approximately 2 times a week, and shortness of breath with exertion.  She denies any tightness in her chest and nocturnal awakenings.  Shee has not required any systemic steroids since her last office visit and has not made any trips to the emergency room or urgent care due to her asthma.  She reports that her asthma has been flaring for the past 4 to 6 weeks and she has had to use her albuterol approximately 2-3 times a week.  She reports that she did have COVID-09 November 2018. She is currently breast-feeding her 47-month-old right now.  Allergic rhinitis is reported as moderately controlled with the use of Claritin 10 mg once a day and the Nettie pot as needed.  She reports occasional sneezing when around dust or vacuuming.  She denies any rhinorrhea, nasal congestion, postnasal drip and itchy watery eyes.  She continues to avoid amoxicillin and penicillin.  She has an appointment next week with her primary care physician to discuss her frequent headaches.  Current medications are as listed in the chart.   Drug Allergies:  Allergies  Allergen Reactions  . Amoxicillin     REACTION: Hives and swelling  . Penicillins Rash    Has patient had a PCN reaction causing immediate rash, facial/tongue/throat swelling, SOB or lightheadedness with hypotension: Yes Has patient had a PCN  reaction causing severe rash involving mucus membranes or skin necrosis: Yes Has patient had a PCN reaction that required hospitalization no Has patient had a PCN reaction occurring within the last 10 years: No If all of the above answers are "NO", then may proceed with Cephalosporin use.    Review of Systems: Review of Systems  Constitutional: Negative for chills and fever.  HENT: Negative for congestion and sore throat.        Denies rhinorrhea  Eyes:       Denies itchy watery eyes  Respiratory: Positive for cough, shortness of breath and wheezing.   Cardiovascular: Negative for chest pain and palpitations.  Gastrointestinal: Positive for heartburn. Negative for abdominal pain.  Genitourinary: Negative for dysuria.  Skin: Negative for itching and rash.  Neurological: Positive for headaches.  Endo/Heme/Allergies: Positive for environmental allergies.     Physical Exam: BP 120/68 (BP Location: Left Arm, Patient Position: Sitting, Cuff Size: Normal)   Pulse 72   Temp 98.7 F (37.1 C) (Temporal)   Resp 16   Ht 5' (1.524 m)   Wt 172 lb 3.2 oz (78.1 kg)   SpO2 99%   BMI 33.63 kg/m    Physical Exam Constitutional:      Appearance: Normal appearance.  HENT:     Head: Normocephalic and atraumatic.     Comments: Pharynx normal. Eyes normal. Ears normal. Nose normal    Right Ear: Tympanic membrane, ear canal and external ear normal.     Left Ear: Tympanic membrane,  ear canal and external ear normal.     Nose: Nose normal.     Mouth/Throat:     Mouth: Mucous membranes are moist.     Pharynx: Oropharynx is clear.  Eyes:     Conjunctiva/sclera: Conjunctivae normal.  Cardiovascular:     Rate and Rhythm: Regular rhythm.     Heart sounds: Normal heart sounds.  Pulmonary:     Effort: Pulmonary effort is normal.     Breath sounds: Normal breath sounds.     Comments: Lungs clear to auscultation Musculoskeletal:     Cervical back: Neck supple.  Skin:    General: Skin is warm.    Neurological:     Mental Status: She is alert and oriented to person, place, and time.  Psychiatric:        Mood and Affect: Mood normal.        Behavior: Behavior normal.        Thought Content: Thought content normal.        Judgment: Judgment normal.     Diagnostics: FVC 3.14 L, FEV1 2.10 L.  Predicted FVC 3.27 L, FEV1 2.76 L.  Spirometry indicates mild airway obstruction.  Status post bronchodilator response shows FVC 3.25 L, FEV1 2.54 L.  Spirometry indicates normal ventilatory function with a 21% improvement in FEV1.  Assessment and Plan: 1. Asthma, not well controlled, moderate persistent, with acute exacerbation   2. Other allergic rhinitis   3. Adverse effect of drug, subsequent encounter     Meds ordered this encounter  Medications  . Fluticasone-Salmeterol,sensor, (AIRDUO DIGIHALER) 232-14 MCG/ACT AEPB    Sig: Inhale 1 puff into the lungs in the morning and at bedtime.    Dispense:  1 each    Refill:  5  . montelukast (SINGULAIR) 10 MG tablet    Sig: Take 1 tablet (10 mg total) by mouth at bedtime.    Dispense:  30 tablet    Refill:  5  . albuterol (VENTOLIN HFA) 108 (90 Base) MCG/ACT inhaler    Sig: Inhale 2 puffs into the lungs every 6 (six) hours as needed for wheezing or shortness of breath.    Dispense:  18 g    Refill:  1    Patient Instructions  Moderate persistent asthma Stop generic Symbicort Start AirDuo 232/14- using one puff twice a day to help prevent cough and wheeze. Start prednisone 10 mg-take 2 tablets in the morning and in the evening for 3 days, then on the fourth day take 2 tablets in the morning and on the fifth day take 1 tablet and stop. Start Singulair 10 mg once a day to help prevent cough and wheeze. Patient cautioned that rarely some children/adults can experience behavioral changes after beginning montelukast. These side effects are rare, however, if you notice any change, notify the clinic and discontinue montelukast. Continue  albuterol 2 puffs every 4 hours as needed for cough, wheeze, tightness in chest, or shortness of breath. Asthma control goals:   Full participation in all desired activities (may need albuterol before activity)  Albuterol use two time or less a week on average (not counting use with activity)  Cough interfering with sleep two time or less a month  Oral steroids no more than once a year  No hospitalizations  Allergic rhinitis ( skin test positive on 12/25/2018 to: dust mite, cat, dog, horse, borderline to mold) Continue Claritin 10 mg once a day Continue saline rinse as needed  Drug reaction (broke out in hives  as a child after amoxicillin) Continue to avoid. Consider testing in the future  Please let us know if this treatment plan is not working well for you. Schedule follow up appointment in 2 months   Return in about 2 months (around 07/27/2020), or if symptoms worsen or fail to improve.    Thank you for the opportunity to care for this patient.  Please do not hesitate to contact me with questions.  Nehemiah Settle, FNP [facility]

## 2020-07-27 ENCOUNTER — Ambulatory Visit: Payer: Commercial Managed Care - PPO | Admitting: Allergy

## 2020-07-27 ENCOUNTER — Other Ambulatory Visit: Payer: Self-pay

## 2020-07-27 ENCOUNTER — Encounter: Payer: Self-pay | Admitting: Allergy

## 2020-07-27 VITALS — BP 128/78 | HR 76 | Temp 99.4°F | Resp 16

## 2020-07-27 DIAGNOSIS — J4540 Moderate persistent asthma, uncomplicated: Secondary | ICD-10-CM

## 2020-07-27 DIAGNOSIS — T50905D Adverse effect of unspecified drugs, medicaments and biological substances, subsequent encounter: Secondary | ICD-10-CM | POA: Diagnosis not present

## 2020-07-27 DIAGNOSIS — J3089 Other allergic rhinitis: Secondary | ICD-10-CM

## 2020-07-27 NOTE — Patient Instructions (Addendum)
Moderate persistent asthma . Today's breathing test looked much better than previous.  . Daily controller medication(s): continue Airduo 1 puff twice a day and rinse mouth after each use.  . Continue Singulair (montelukast) 10mg  daily at night. . May use albuterol rescue inhaler 2 puffs every 4 to 6 hours as needed for shortness of breath, chest tightness, coughing, and wheezing. May use albuterol rescue inhaler 2 puffs 5 to 15 minutes prior to strenuous physical activities. Monitor frequency of use.  . If asthma is not controlled with above regimen, then let know. . Asthma control goals:  o Full participation in all desired activities (may need albuterol before activity) o Albuterol use two times or less a week on average (not counting use with activity) o Cough interfering with sleep two times or less a month o Oral steroids no more than once a year o No hospitalizations  Allergic rhinitis   2020 skin testing positive to dust mites, cat, dog and horse, borderline to mold.   Continue environmental control measures.   Continue Singulair (montelukast) 10mg  daily at night.  May use over the counter antihistamines such as Zyrtec (cetirizine), Claritin (loratadine), Allegra (fexofenadine), or Xyzal (levocetirizine) daily as needed.  May use nasal saline spray (i.e., Simply Saline) or nasal saline lavage (i.e., NeilMed) as needed.   If not controlled with above regimen, then we can discuss allergy injections at next visit.   Drug reaction (broke out in hives as a child after amoxicillin)  Continue to avoid. Consider testing in the future.  Follow up in 3-4 months or sooner if needed.  Control of House Dust Mite Allergen . Dust mite allergens are a common trigger of allergy and asthma symptoms. While they can be found throughout the house, these microscopic creatures thrive in warm, humid environments such as bedding, upholstered furniture and carpeting. . Because so much time  is spent in the bedroom, it is essential to reduce mite levels there.  . Encase pillows, mattresses, and box springs in special allergen-proof fabric covers or airtight, zippered plastic covers.  . Bedding should be washed weekly in hot water (130 F) and dried in a hot dryer. Allergen-proof covers are available for comforters and pillows that can't be regularly washed.  [place] the allergy-proof covers every few months. Minimize clutter in the bedroom. Keep pets out of the bedroom.  Keep humidity less than 50% by using a dehumidifier or air conditioning. You can buy a humidity measuring device called a hygrometer to monitor this.  . If possible, replace carpets with hardwood, linoleum, or washable area rugs. If that's not possible, vacuum frequently with a vacuum that has a HEPA filter. . Remove all upholstered furniture and non-washable window drapes from the bedroom. . Remove all non-washable stuffed toys from the bedroom.  Wash stuffed toys weekly. Pet Allergen Avoidance: . Contrary to popular opinion, there are no "hypoallergenic" breeds of dogs or cats. That is because people are not allergic to an animal's hair, but to an allergen found in the animal's saliva, dander (dead skin flakes) or urine. Pet allergy symptoms typically occur within minutes. For some people, symptoms can build up and become most severe 8 to 12 hours after contact with the animal. People with severe allergies can experience reactions in public places if dander has been transported on the pet owners' clothing. Reyes Ivan Keeping an animal outdoors is only a partial solution, since homes with pets in the yard still have higher concentrations of animal allergens. Marland Kitchen  Before getting a pet, ask your allergist to determine if you are allergic to animals. If your pet is already considered part of your family, try to minimize contact and keep the pet out of the bedroom and other rooms where you spend a great deal of time. . As with dust mites,  vacuum carpets often or replace carpet with a hardwood floor, tile or linoleum. . High-efficiency particulate air (HEPA) cleaners can reduce allergen levels over time. . While dander and saliva are the source of cat and dog allergens, urine is the source of allergens from rabbits, hamsters, mice and [place] pigs; so ask a non-allergic family member to clean the animal's cage. . If you have a pet allergy, talk to your allergist about the potential for allergy immunotherapy (allergy shots). This strategy can often provide long-term relief. Mold Control . Mold and fungi can grow on a variety of surfaces provided certain temperature and moisture conditions exist.  . Outdoor molds grow on plants, decaying vegetation and soil. The major outdoor mold, Alternaria and Cladosporium, are found in very high numbers during hot and dry conditions. Generally, a late summer - fall peak is seen for common outdoor fungal spores. Rain will temporarily lower outdoor mold spore count, but counts rise rapidly when the rainy period ends. . The most important indoor molds are Aspergillus and Penicillium. Dark, humid and poorly ventilated basements are ideal sites for mold growth. The next most common sites of mold growth are the bathroom and the kitchen. Outdoor (Seasonal) Mold Control . Use air conditioning and keep windows closed. . Avoid exposure to decaying vegetation. Marland Kitchen Avoid leaf raking. . Avoid grain handling. . Consider wearing a face mask if working in moldy areas.  Indoor (Perennial) Mold Control  . Maintain humidity below 50%. . Get rid of mold growth on hard surfaces with water, detergent and, if necessary, 5% bleach (do not mix with other cleaners). Then dry the area completely. If mold covers an area more than 10 square feet, consider hiring an indoor environmental professional. . For clothing, washing with soap and water is best. If moldy items cannot be cleaned and dried, throw them away. . Remove sources  e.g. contaminated carpets. . Repair and seal leaking roofs or pipes. Using dehumidifiers in damp basements may be helpful, but empty the water and clean units regularly to prevent mildew from forming. All rooms, especially basements, bathrooms and kitchens, require ventilation and cleaning to deter mold and mildew growth. Avoid carpeting on concrete or damp floors, and storing items in damp areas.

## 2020-07-27 NOTE — Assessment & Plan Note (Signed)
Past history - rhinitis symptoms for the past 20 years mainly during season changes.  No recent ENT evaluation. 2020 skin testing showed: Positive to dust mites, cat, dog and horse, borderline to mold.  Interim history - improved with Singulair. Now on hydroxyzine for anxiety. Stopped Claritin.  Continue environmental control measures.   Continue Singulair (montelukast) 10mg  daily at night.  May use over the counter antihistamines such as Zyrtec (cetirizine), Claritin (loratadine), Allegra (fexofenadine), or Xyzal (levocetirizine) daily as needed.  May use nasal saline spray (i.e., Simply Saline) or nasal saline lavage (i.e., NeilMed) as needed.   If above regimen does not control rhinitis and asthma symptoms then will discuss allergy immunotherapy in more detail at the next visit.

## 2020-07-27 NOTE — Assessment & Plan Note (Signed)
Past history - had asthma as a high schooler and main trigger was dog dander.  Interim history - doing better with Airduo and Singulair. Only had to use albuterol twice since the last visit due to coughing at night.  . Today's spirometry was much improved from last one.  . Daily controller medication(s): continue Airduo 1 puff twice a day and rinse mouth after each use.  . Continue Singulair (montelukast) 10mg  daily at night. . May use albuterol rescue inhaler 2 puffs every 4 to 6 hours as needed for shortness of breath, chest tightness, coughing, and wheezing. May use albuterol rescue inhaler 2 puffs 5 to 15 minutes prior to strenuous physical activities. Monitor frequency of use.  . If asthma is not controlled with above regimen, then will consider adding Spiriva and/or biologic therapy next.

## 2020-07-27 NOTE — Progress Notes (Signed)
Follow Up Note  RE: Nicole Collins MRN: [id number] DOB: Sep 25, 1986 Date of Office Visit: 07/27/2020  Referring provider: Blair Heys, MD Primary care provider: Blair Heys, MD  Chief Complaint: Asthma  History of Present Illness: I had the pleasure of seeing Nicole Collins for a follow up visit at the [facility] on 07/27/2020. She is a 34 y.o. female, who is being followed for asthma, allergic rhinitis and adverse drug reaction. Her previous allergy office visit was on 05/27/2020 with Nehemiah Settle FNP. Today is a regular follow up visit.  Asthma:  Patient is doing much better since the last visit. Currently on Singulair 10mg  daily and Airduo 232 1 puff twice a day.  Had to use albuterol 2 times since the last visit - this was due to coughing in the middle of the night.  Denies any ER/urgent care visits or prednisone use since the last visit.  Occasional heartburn and takes tums as needed.   Allergic rhinitis Currently only taking Singulair daily with good benefit.  Not using Claritin anymore.  Using nettipot as needed.   Using hydroxyzine now for anxiety.   Assessment and Plan: Nicole Collins is a 34 y.o. female with: Moderate persistent asthma without complication Past history - had asthma as a high schooler and main trigger was dog dander.  Interim history - doing better with Airduo and Singulair. Only had to use albuterol twice since the last visit due to coughing at night.  . Today's spirometry was much improved from last one.  . Daily controller medication(s): continue Airduo 20 1 puff twice a day and rinse mouth after each use.  . Continue Singulair (montelukast) 10mg  daily at night. . May use albuterol rescue inhaler 2 puffs every 4 to 6 hours as needed for shortness of breath, chest tightness, coughing, and wheezing. May use albuterol rescue inhaler 2 puffs 5 to 15 minutes prior to strenuous physical activities. Monitor frequency of use.   . If asthma is not controlled with above regimen, then will consider adding Spiriva and/or biologic therapy next.   Other allergic rhinitis Past history - rhinitis symptoms for the past 20 years mainly during season changes.  No recent ENT evaluation. 2020 skin testing showed: Positive to dust mites, cat, dog and horse, borderline to mold.  Interim history - improved with Singulair. Now on hydroxyzine for anxiety. Stopped Claritin.  Continue environmental control measures.   Continue Singulair (montelukast) 10mg  daily at night.  May use over the counter antihistamines such as Zyrtec (cetirizine), Claritin (loratadine), Allegra (fexofenadine), or Xyzal (levocetirizine) daily as needed.  May use nasal saline spray (i.e., Simply Saline) or nasal saline lavage (i.e., NeilMed) as needed.   If above regimen does not control rhinitis and asthma symptoms then will discuss allergy immunotherapy in more detail at the next visit.  Drug reaction Broke out in hives as a child after amoxicillin.  Continue to avoid. Consider testing in future.   Return in about 4 months (around 11/27/2020).  Diagnostics: Spirometry:  Tracings reviewed. Her effort: Good reproducible efforts. FVC: 3.38L FEV1: 2.55L, 93% predicted FEV1/FVC ratio: 75% Interpretation: Spirometry consistent with normal pattern.  Please see scanned spirometry results for details.  Medication List:  Current Outpatient Medications  Medication Sig Dispense Refill  . acetaminophen (TYLENOL) 500 MG tablet Take 2 tablets (1,000 mg total) by mouth every 6 (six) hours. 30 tablet 0  . Fluticasone-Salmeterol,sensor, (AIRDUO DIGIHALER) 232-14 MCG/ACT AEPB Inhale 1 puff into the lungs in the morning and at  bedtime. 1 each 5  . hydrOXYzine (ATARAX/VISTARIL) 50 MG tablet Take 50 mg by mouth as needed.    Marland Kitchen ibuprofen (ADVIL,MOTRIN) 600 MG tablet Take 1 tablet (600 mg total) by mouth every 6 (six) hours as needed for moderate pain. 30 tablet 0  .  montelukast (SINGULAIR) 10 MG tablet Take 1 tablet (10 mg total) by mouth at bedtime. 30 tablet 5  . Prenatal Vit-Fe Fumarate-FA (PRENATAL MULTIVITAMIN) TABS tablet Take 1 tablet by mouth at bedtime.     Marland Kitchen PROAIR HFA 108 (90 Base) MCG/ACT inhaler Inhale 1-2 puffs into the lungs every 6 (six) hours as needed for wheezing or shortness of breath.      No current facility-administered medications for this visit.   Allergies: Allergies  Allergen Reactions  . Amoxicillin     REACTION: Hives and swelling  . Penicillins Rash    Has patient had a PCN reaction causing immediate rash, facial/tongue/throat swelling, SOB or lightheadedness with hypotension: Yes Has patient had a PCN reaction causing severe rash involving mucus membranes or skin necrosis: Yes Has patient had a PCN reaction that required hospitalization no Has patient had a PCN reaction occurring within the last 10 years: No If all of the above answers are "NO", then may proceed with Cephalosporin use.   I reviewed her past medical history, social history, family history, and environmental history and no significant changes have been reported from her previous visit.  Review of Systems  Constitutional: Negative for appetite change, chills, fever and unexpected weight change.  HENT: Negative for congestion and rhinorrhea.   Eyes: Negative for itching.  Respiratory: Negative for cough, chest tightness, shortness of breath and wheezing.   Cardiovascular: Negative for chest pain.  Gastrointestinal: Negative for abdominal pain.  Genitourinary: Negative for difficulty urinating.  Skin: Negative for rash.  Allergic/Immunologic: Positive for environmental allergies. Negative for food allergies.  Neurological: Negative for headaches.   Objective: BP 128/78   Pulse 76   Temp 99.4 F (37.4 C) (Temporal)   Resp 16   SpO2 99%  There is no height or weight on file to calculate BMI. Physical Exam Vitals and nursing note reviewed.    Constitutional:      Appearance: Normal appearance. She is well-developed.  HENT:     Head: Normocephalic and atraumatic.     Right Ear: External ear normal.     Left Ear: External ear normal.     Nose: Nose normal.     Mouth/Throat:     Mouth: Mucous membranes are moist.     Pharynx: Oropharynx is clear.  Eyes:     Conjunctiva/sclera: Conjunctivae normal.  Cardiovascular:     Rate and Rhythm: Normal rate and regular rhythm.     Heart sounds: Normal heart sounds. No murmur heard.  No friction rub. No gallop.   Pulmonary:     Effort: Pulmonary effort is normal.     Breath sounds: Normal breath sounds. No wheezing or rales.  Musculoskeletal:     Cervical back: Neck supple.  Lymphadenopathy:     Cervical: No cervical adenopathy.  Skin:    General: Skin is warm.     Findings: No rash.  Neurological:     Mental Status: She is alert and oriented to person, place, and time.  Psychiatric:        Behavior: Behavior normal.    Previous notes and tests were reviewed. The plan was reviewed with the patient/family, and all questions/concerned were addressed.  It was my  pleasure to see Nicole Collins today and participate in her care. Please feel free to contact me with any questions or concerns.  Sincerely,  Wyline Mood, DO Allergy & Immunology  [facility] office: [phone] [facility] office: [phone]

## 2020-07-27 NOTE — Assessment & Plan Note (Signed)
Broke out in hives as a child after amoxicillin.  Continue to avoid. Consider testing in future.

## 2020-08-28 ENCOUNTER — Other Ambulatory Visit: Payer: Self-pay | Admitting: Family

## 2020-08-28 NOTE — Telephone Encounter (Signed)
Ok to send prescription for montelukast 10 mg take 1 tablet at bedtime with 3 refills.  Thank you, Wynona Canes

## 2020-09-25 IMAGING — CT CT ABDOMEN AND PELVIS WITH CONTRAST
2 of 4 series · 17 of 46 positions shown, 19 images · IV contrast (APPLIED)
Comparison: None.

CLINICAL DATA: Central abdominal pain

EXAM:
CT ABDOMEN AND PELVIS WITH CONTRAST
TECHNIQUE: Multidetector CT imaging of the abdomen and pelvis was performed
using the standard protocol following bolus administration of
intravenous contrast.
CONTRAST:  100mL OMNIPAQUE IOHEXOL 300 MG/ML  SOLN

[Series 3: abdomen 5.0 · axial · 0.78mm/px · z∈[+770,+1175]mm · 14 of 91 slices shown, 16 images]
[im 5/91  soft-tissue]
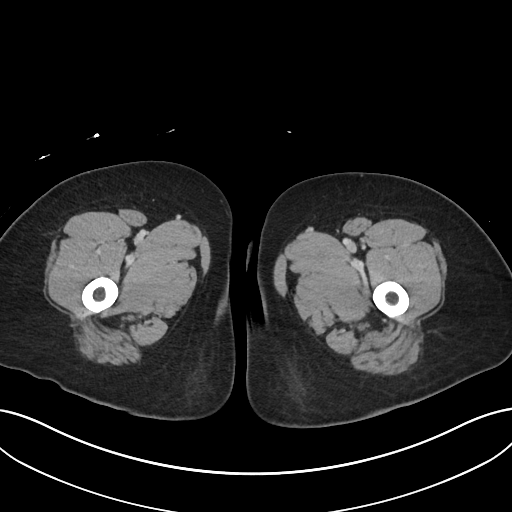
[im 5/91  bone]
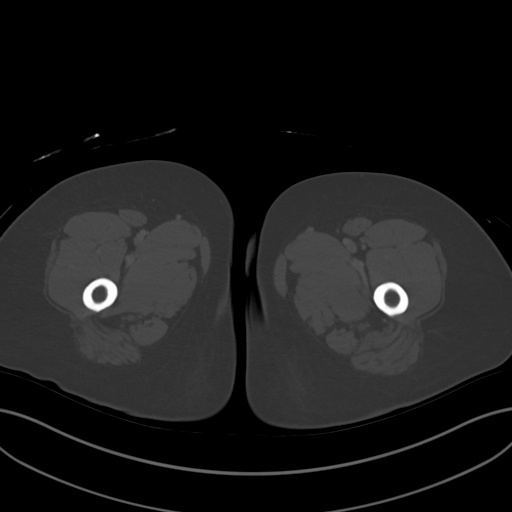
[im 10/91  soft-tissue]
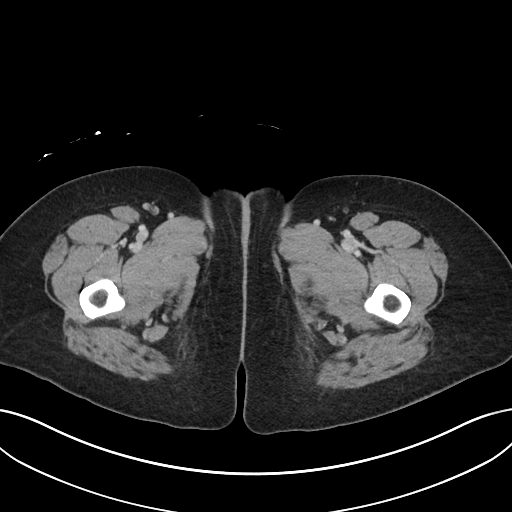
[im 19/91  soft-tissue]
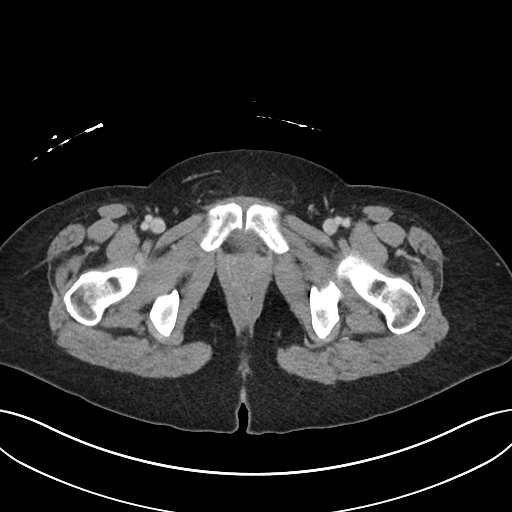
[im 24/91  soft-tissue]
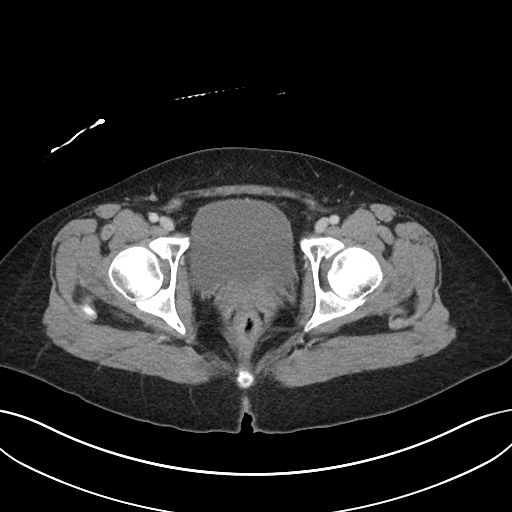
[im 29/91  soft-tissue]
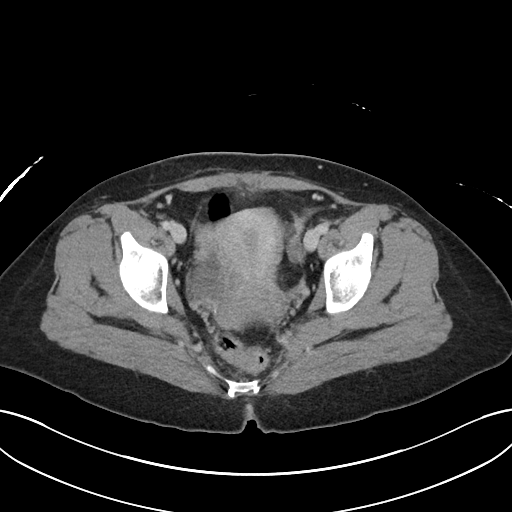
[im 38/91  soft-tissue]
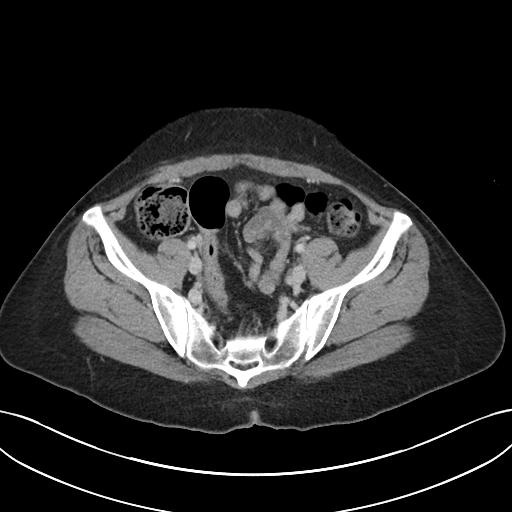
[im 43/91  soft-tissue]
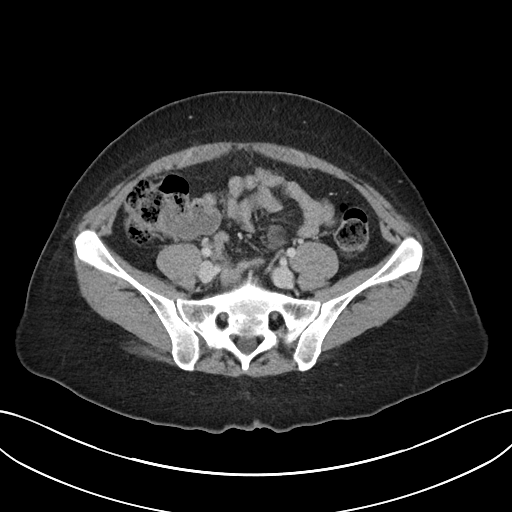
[im 48/91  soft-tissue]
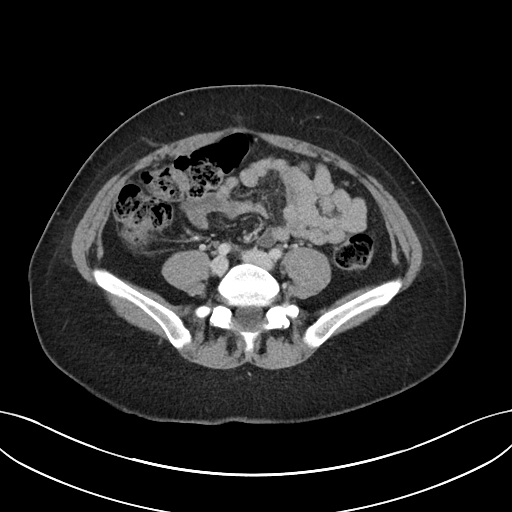
[im 53/91  soft-tissue]
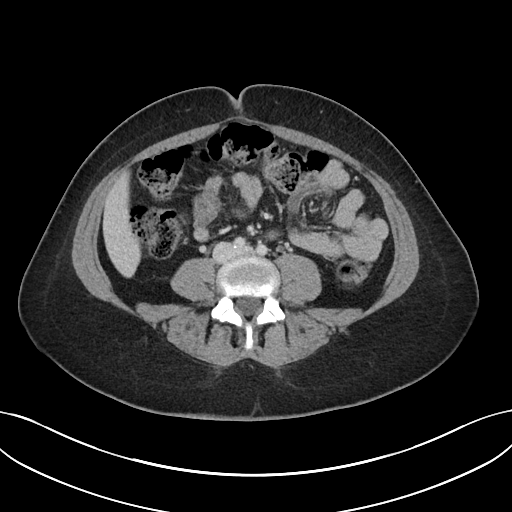
[im 53/91  bone]
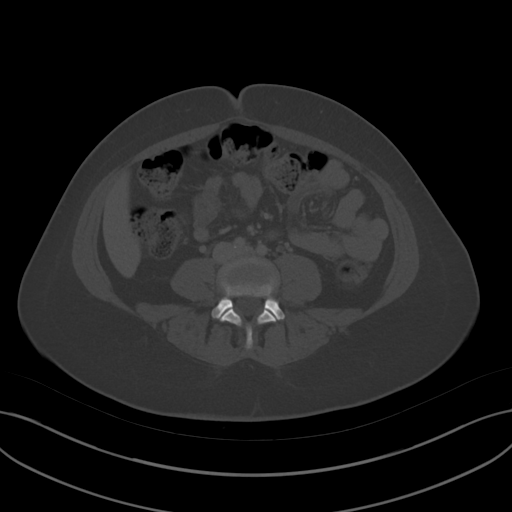
[im 62/91  soft-tissue]
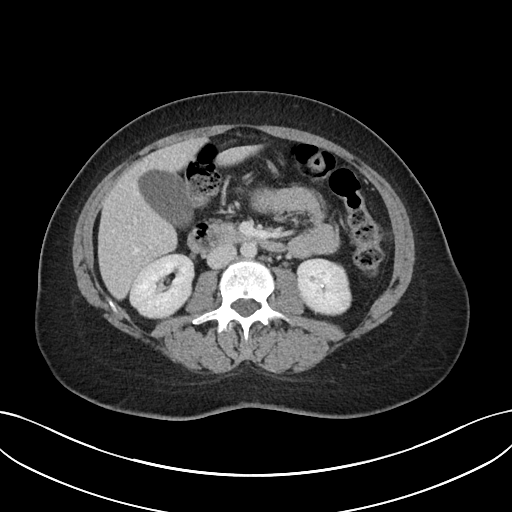
[im 67/91  soft-tissue]
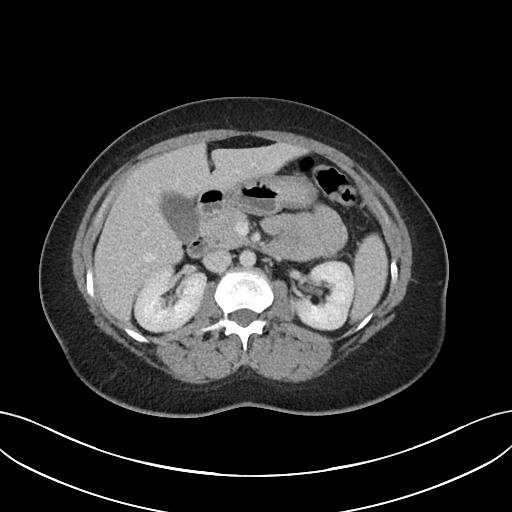
[im 72/91  soft-tissue]
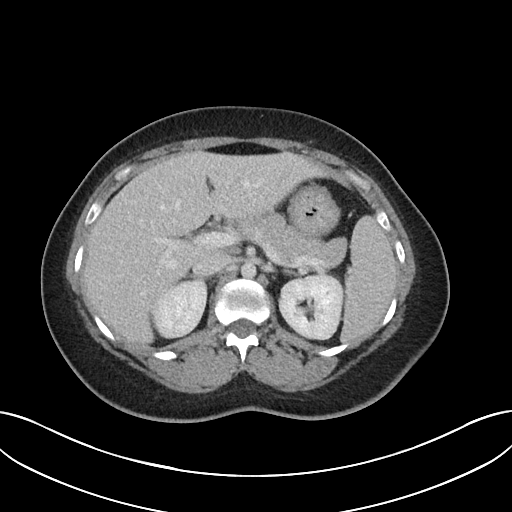
[im 81/91  soft-tissue]
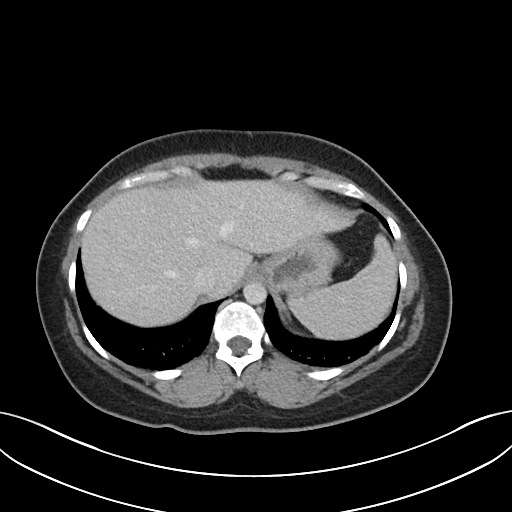
[im 86/91  soft-tissue]
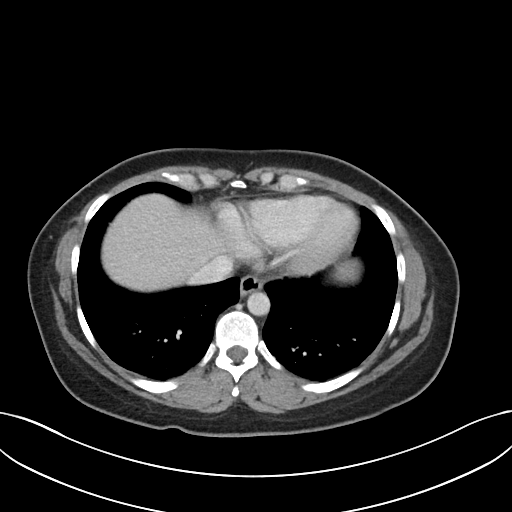

[Series 6: abdomen 3.0 mpr cor · coronal · 0.83mm/px · 3 of 99 slices shown]
[im 33/99  soft-tissue]
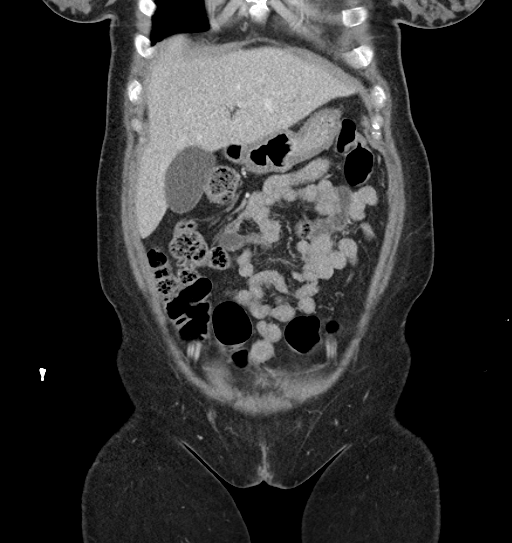
[im 44/99  soft-tissue]
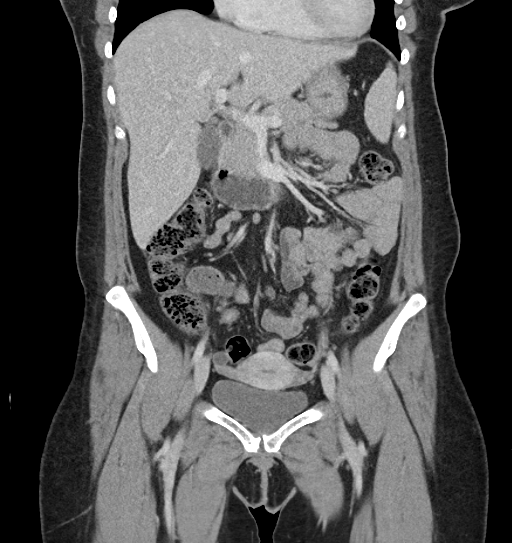
[im 55/99  soft-tissue]
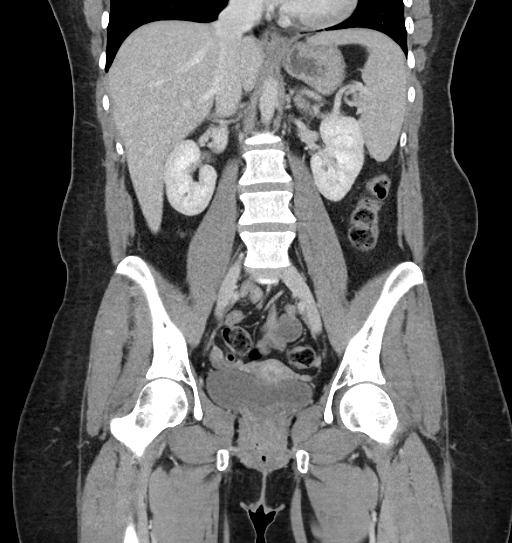

[17 of 46 positions shown; findings below may reference images not displayed]

FINDINGS: LOWER CHEST: There is no basilar pleural or apical pericardial
effusion.

HEPATOBILIARY: The hepatic contours and density are normal. There is
no intra- or extrahepatic biliary dilatation. The gallbladder is
normal.

PANCREAS: The pancreatic parenchymal contours are normal and there
is no ductal dilatation. There is no peripancreatic fluid
collection.

SPLEEN: Normal.

ADRENALS/URINARY TRACT:

--Adrenal glands: Normal.

--Right kidney/ureter: No hydronephrosis, nephroureterolithiasis,
perinephric stranding or solid renal mass.

--Left kidney/ureter: No hydronephrosis, nephroureterolithiasis,
perinephric stranding or solid renal mass.

--Urinary bladder: Normal for degree of distention

STOMACH/BOWEL:

--Stomach/Duodenum: There is no hiatal hernia or other gastric
abnormality. The duodenal course and caliber are normal.

--Small bowel: No dilatation or inflammation.

--Colon: No focal abnormality.

--Appendix: Normal.

VASCULAR/LYMPHATIC: Normal course and caliber of the major abdominal
vessels. No abdominal or pelvic lymphadenopathy.

REPRODUCTIVE: Normal uterus and ovaries.

MUSCULOSKELETAL. No bony spinal canal stenosis or focal osseous
abnormality.

OTHER: None.
IMPRESSION: No acute abnormality of the abdomen or pelvis.

## 2020-10-25 ENCOUNTER — Other Ambulatory Visit: Payer: Self-pay | Admitting: Family

## 2020-11-30 ENCOUNTER — Ambulatory Visit: Payer: Commercial Managed Care - PPO | Admitting: Allergy

## 2020-12-01 ENCOUNTER — Other Ambulatory Visit: Payer: Self-pay

## 2020-12-01 ENCOUNTER — Ambulatory Visit: Payer: Commercial Managed Care - PPO | Admitting: Family Medicine

## 2020-12-01 ENCOUNTER — Encounter: Payer: Self-pay | Admitting: Family Medicine

## 2020-12-01 VITALS — BP 118/80 | HR 99 | Temp 97.9°F | Resp 18 | Ht 61.0 in | Wt 169.8 lb

## 2020-12-01 DIAGNOSIS — J4540 Moderate persistent asthma, uncomplicated: Secondary | ICD-10-CM

## 2020-12-01 DIAGNOSIS — T50905D Adverse effect of unspecified drugs, medicaments and biological substances, subsequent encounter: Secondary | ICD-10-CM | POA: Diagnosis not present

## 2020-12-01 DIAGNOSIS — J3089 Other allergic rhinitis: Secondary | ICD-10-CM

## 2020-12-01 DIAGNOSIS — J302 Other seasonal allergic rhinitis: Secondary | ICD-10-CM | POA: Diagnosis not present

## 2020-12-01 MED ORDER — AIRDUO DIGIHALER 232-14 MCG/ACT IN AEPB: 1.0000 | INHALATION_SPRAY | Freq: Two times a day (BID) | RESPIRATORY_TRACT | 5 refills | Status: DC

## 2020-12-01 NOTE — Patient Instructions (Signed)
Moderate persistent asthma Continue AirDuo 232/14- using one puff twice a day to help prevent cough and wheeze. Continue Singulair 10 mg once a day to help prevent cough and wheeze. Continue albuterol 2 puffs every 4 hours as needed for cough, wheeze, tightness in chest, or shortness of breath. Asthma control goals:   Full participation in all desired activities (may need albuterol before activity)  Albuterol use two time or less a week on average (not counting use with activity)  Cough interfering with sleep two time or less a month  Oral steroids no more than once a year  No hospitalizations  Allergic rhinitis ( skin test positive on 12/25/2018 to: dust mite, cat, dog, horse, borderline to mold) Continue Claritin 10 mg once a day as needed for a runny nose. Remember to rotate to a different antihistamine about every 3 months. Some examples of over the counter antihistamines include Zyrtec (cetirizine), Xyzal (levocetirizine), Allegra (fexofenadine), and Claritin (loratidine).  Continue saline rinse as needed  Drug reaction (broke out in hives as a child after amoxicillin) Continue to avoid. Consider testing in the future  Call the clinic if this treatment plan is not working well for you  Follow up in 6 months or sooner if needed.

## 2020-12-01 NOTE — Progress Notes (Signed)
[address] [place] [address] Dept: [phone]  FOLLOW UP NOTE  Patient ID: Nicole Collins, female    DOB: [birth year]  Age: 35 y.o. MRN: [id number] Date of Office Visit: 12/01/2020  Assessment  Chief Complaint: Asthma  HPI Nicole Collins is a 35 year old female who presents the clinic for follow-up visit.  She was last seen in this clinic on 07/27/2020 for evaluation of asthma, allergic rhinitis, and drug allergy to amoxicillin.  At today's visit she reports her asthma has been well controlled with no shortness of breath, cough, or wheeze.  She continues AirDuo 232-1 puff twice a day, montelukast 10 mg once a day, and has used albuterol 2 times in the last month with relief of symptoms.  Allergic rhinitis is reported as well controlled with moderate sneezing occurring during the month of December.  She continues nasal saline rinse and Claritin 10 mg once a day as needed.  She reports that she had an allergic reaction to amoxicillin at about age 10 with symptoms including hives and tongue swelling which resolved with Benadryl.  She has avoided amoxicillin since that episode.  She reports that she has not had an infection requiring antibiotics for about 3 or 4 years.  Her current medications are listed in the chart.   Drug Allergies:  Allergies  Allergen Reactions  . Amoxicillin     REACTION: Hives and swelling  . Penicillins Rash    Has patient had a PCN reaction causing immediate rash, facial/tongue/throat swelling, SOB or lightheadedness with hypotension: Yes Has patient had a PCN reaction causing severe rash involving mucus membranes or skin necrosis: Yes Has patient had a PCN reaction that required hospitalization no Has patient had a PCN reaction occurring within the last 10 years: No If all of the above answers are "NO", then may proceed with Cephalosporin use.    Physical Exam: BP 118/80   Pulse 99   Temp 97.9 F (36.6 C) (Temporal)   Resp 18   Ht 5\' 1"  (1.549  m)   Wt 169 lb 12.8 oz (77 kg)   SpO2 100%   BMI 32.08 kg/m    Physical Exam Vitals reviewed.  Constitutional:      Appearance: Normal appearance.  HENT:     Head: Normocephalic and atraumatic.     Right Ear: Tympanic membrane normal.     Left Ear: Tympanic membrane normal.     Nose:     Comments: Bilateral nares slightly erythematous with clear nasal drainage noted.  Pharynx normal.  Ears normal.  Eyes normal.    Mouth/Throat:     Pharynx: Oropharynx is clear.  Eyes:     Conjunctiva/sclera: Conjunctivae normal.  Cardiovascular:     Rate and Rhythm: Normal rate and regular rhythm.     Heart sounds: Normal heart sounds. No murmur heard.   Pulmonary:     Effort: Pulmonary effort is normal.     Breath sounds: Normal breath sounds.     Comments: Lungs clear to auscultation Musculoskeletal:        General: Normal range of motion.     Cervical back: Normal range of motion and neck supple.  Skin:    General: Skin is warm and dry.  Neurological:     Mental Status: She is alert and oriented to person, place, and time.  Psychiatric:        Mood and Affect: Mood normal.        Behavior: Behavior normal.  Thought Content: Thought content normal.        Judgment: Judgment normal.     Diagnostics: FVC 3.54, FEV1 2.61.  Predicted FVC 3.40, predicted FEV1 2.85.  Spirometry indicates normal ventilatory function.  Assessment and Plan: 1. Moderate persistent asthma without complication   2. Seasonal and perennial allergic rhinitis   3. Adverse effect of drug, subsequent encounter     Meds ordered this encounter  Medications  . Fluticasone-Salmeterol,sensor, (AIRDUO DIGIHALER) 232-14 MCG/ACT AEPB    Sig: Inhale 1 puff into the lungs in the morning and at bedtime.    Dispense:  1 each    Refill:  5    Patient Instructions  Moderate persistent asthma Continue AirDuo 232/14- using one puff twice a day to help prevent cough and wheeze. Continue Singulair 10 mg once a day  to help prevent cough and wheeze. Continue albuterol 2 puffs every 4 hours as needed for cough, wheeze, tightness in chest, or shortness of breath. Asthma control goals:   Full participation in all desired activities (may need albuterol before activity)  Albuterol use two time or less a week on average (not counting use with activity)  Cough interfering with sleep two time or less a month  Oral steroids no more than once a year  No hospitalizations  Allergic rhinitis ( skin test positive on 12/25/2018 to: dust mite, cat, dog, horse, borderline to mold) Continue Claritin 10 mg once a day as needed for a runny nose. Remember to rotate to a different antihistamine about every 3 months. Some examples of over the counter antihistamines include Zyrtec (cetirizine), Xyzal (levocetirizine), Allegra (fexofenadine), and Claritin (loratidine).  Continue saline rinse as needed  Drug reaction (broke out in hives as a child after amoxicillin) Continue to avoid. Consider testing in the future  Call the clinic if this treatment plan is not working well for you  Follow up in 6 months or sooner if needed.    Return in about 6 months (around 05/31/2021), or if symptoms worsen or fail to improve.    Thank you for the opportunity to care for this patient.  Please do not hesitate to contact me with questions.  Thermon Leyland, FNP [facility]

## 2020-12-05 ENCOUNTER — Telehealth: Payer: Self-pay | Admitting: Family Medicine

## 2020-12-05 MED ORDER — AIRDUO DIGIHALER 232-14 MCG/ACT IN AEPB: 1.0000 | INHALATION_SPRAY | Freq: Two times a day (BID) | RESPIRATORY_TRACT | 5 refills | Status: DC

## 2020-12-05 NOTE — Telephone Encounter (Signed)
Called and spoke to patient and we discussed that her airduo was the issue.I sent in the the refill and advised patient to show the coupon provided by [employer] to help with the cost. Patient was advised to call our office back if any issues were to arise later. Patient agreed.

## 2020-12-05 NOTE — Telephone Encounter (Signed)
Pt's mother called pharmacy about fluticasone, pharmacy advised that they needed permission from the provider or insurance to fill out prescription. Pt would like approval so pharmacy could fill out the prescription.   Please advise.

## 2020-12-14 ENCOUNTER — Telehealth: Payer: Self-pay | Admitting: Allergy

## 2020-12-14 NOTE — Telephone Encounter (Signed)
Patient has developed bronchitis and she said she read online that her albuterol rescue inhaler can help alleviate her coughing and congestion. If so, how many times a day can she use this.

## 2020-12-14 NOTE — Telephone Encounter (Signed)
Spoke with patient and advised her use her albuterol every 4-6 hours as needed for cough or wheeze. She is also using Air duo and Singulair daily for asthma.

## 2021-02-16 IMAGING — CT CT ABDOMEN AND PELVIS WITH CONTRAST
2 of 4 series · 16 of 46 positions shown, 18 images · IV contrast (omnipaque)
Comparison: 01/06/2019 CT abdomen/pelvis.

CLINICAL DATA: Right lower quadrant abdominal pain with nausea and
vomiting since yesterday afternoon.

EXAM:
CT ABDOMEN AND PELVIS WITH CONTRAST
TECHNIQUE: Multidetector CT imaging of the abdomen and pelvis was performed
using the standard protocol following bolus administration of
intravenous contrast.
CONTRAST:  100mL OMNIPAQUE IOHEXOL 300 MG/ML  SOLN

[Series 3: abdomen 5.0 · axial · 0.71mm/px · z∈[-469,-94]mm · 13 of 85 slices shown, 15 images]
[im 5/85  soft-tissue]
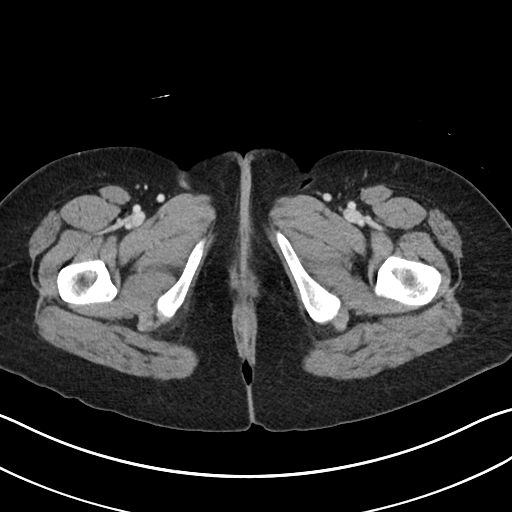
[im 5/85  bone]
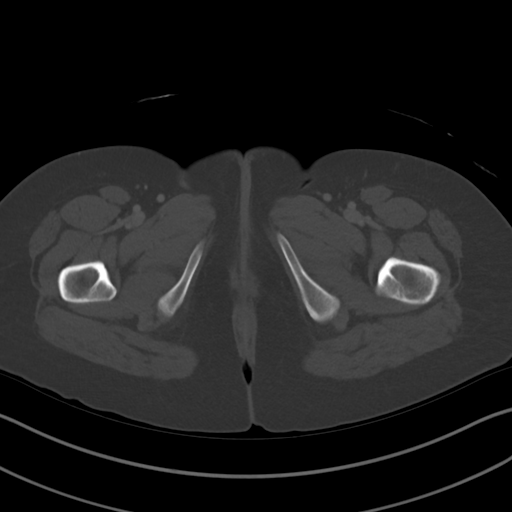
[im 10/85  soft-tissue]
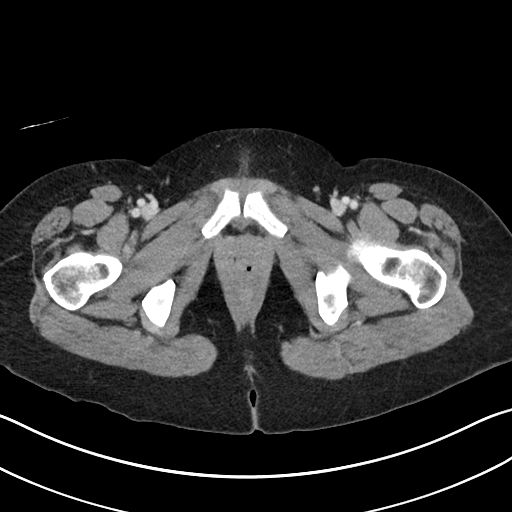
[im 19/85  soft-tissue]
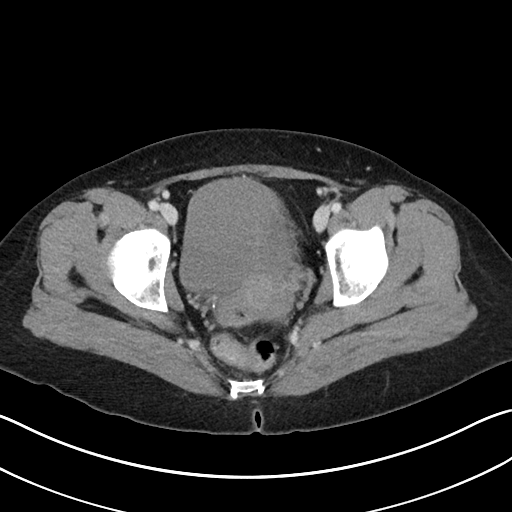
[im 24/85  soft-tissue]
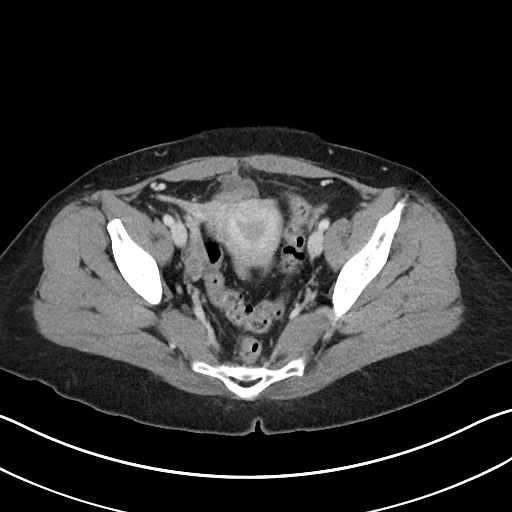
[im 29/85  soft-tissue]
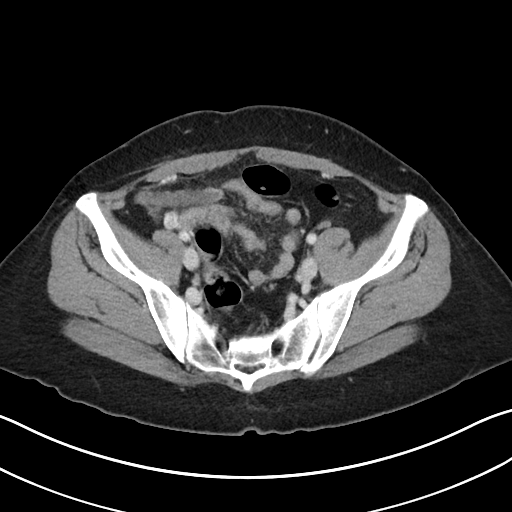
[im 38/85  soft-tissue]
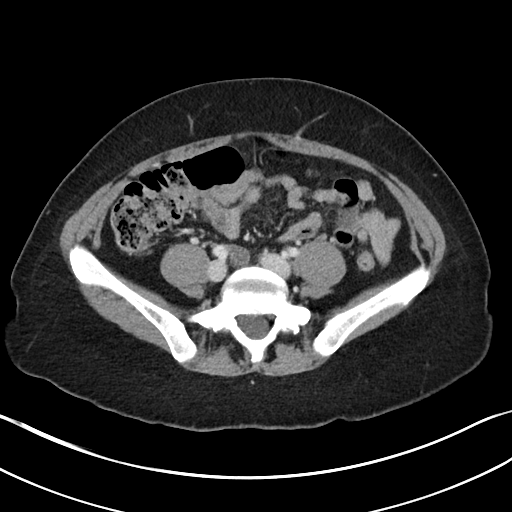
[im 43/85  soft-tissue]
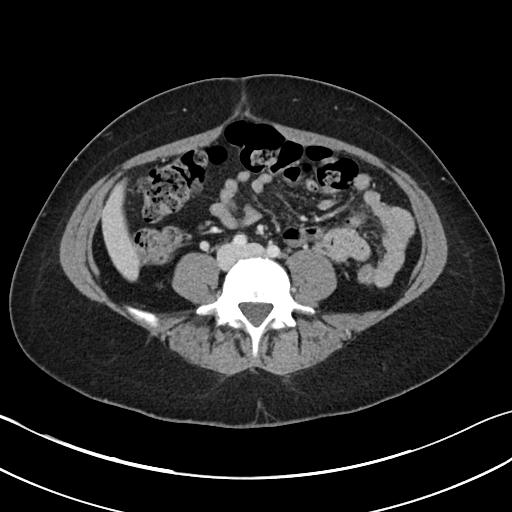
[im 47/85  soft-tissue]
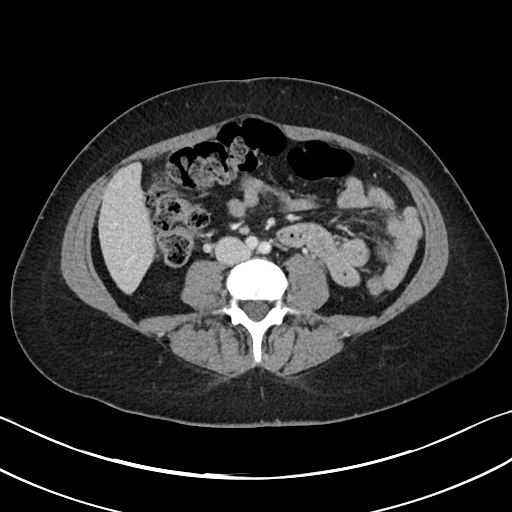
[im 57/85  soft-tissue]
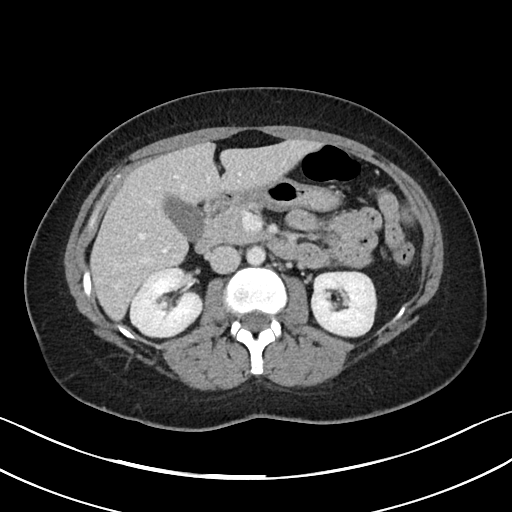
[im 57/85  bone]
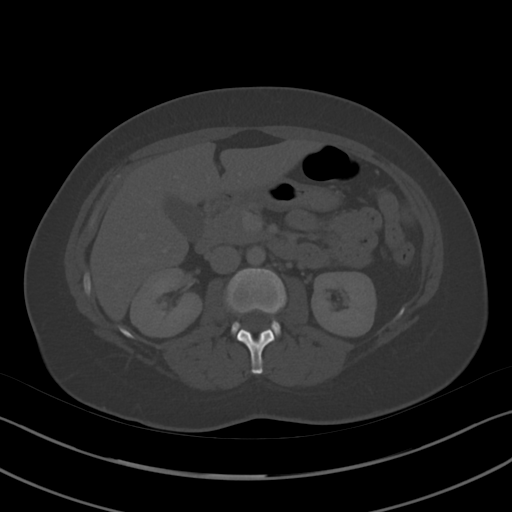
[im 61/85  soft-tissue]
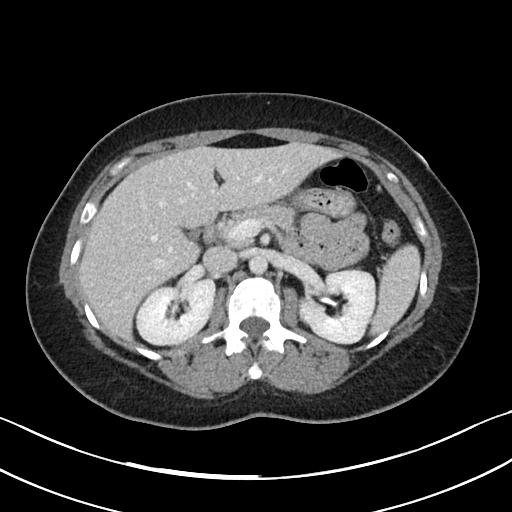
[im 66/85  soft-tissue]
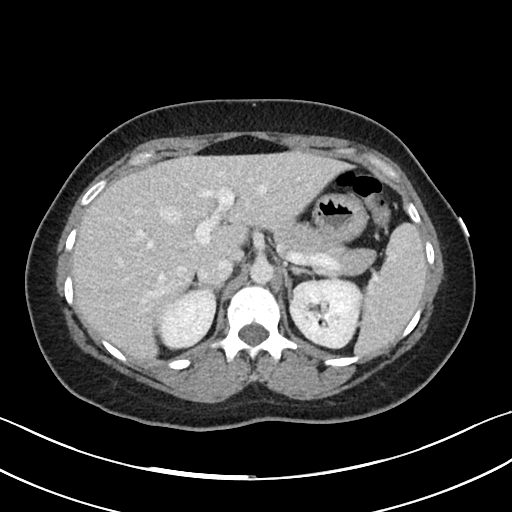
[im 75/85  soft-tissue]
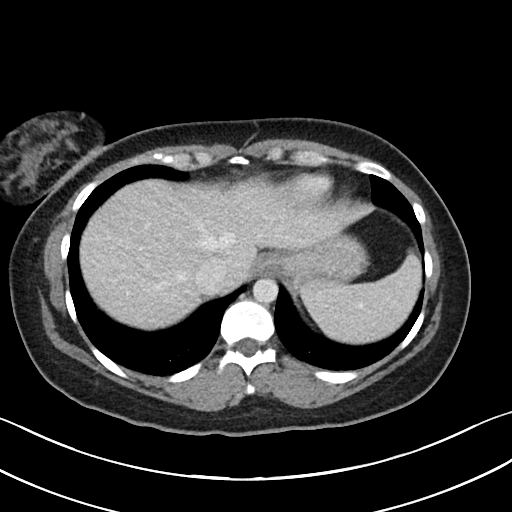
[im 80/85  soft-tissue]
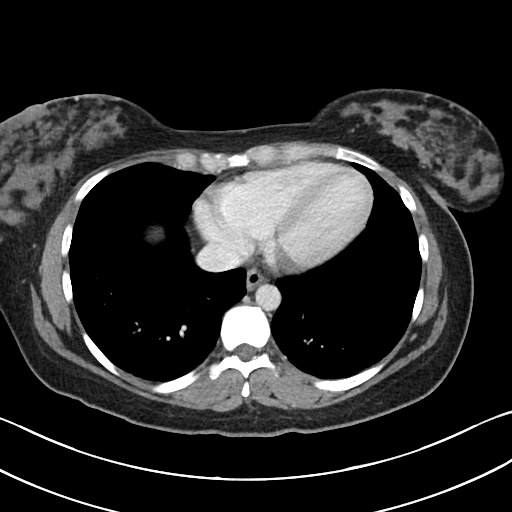

[Series 6: abdomen 3.0 mpr cor · coronal · 0.65mm/px · 3 of 73 slices shown]
[im 25/73  soft-tissue]
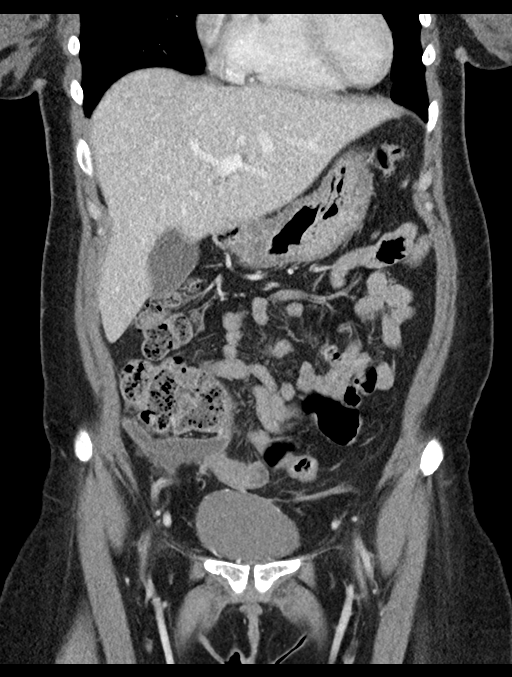
[im 33/73  soft-tissue]
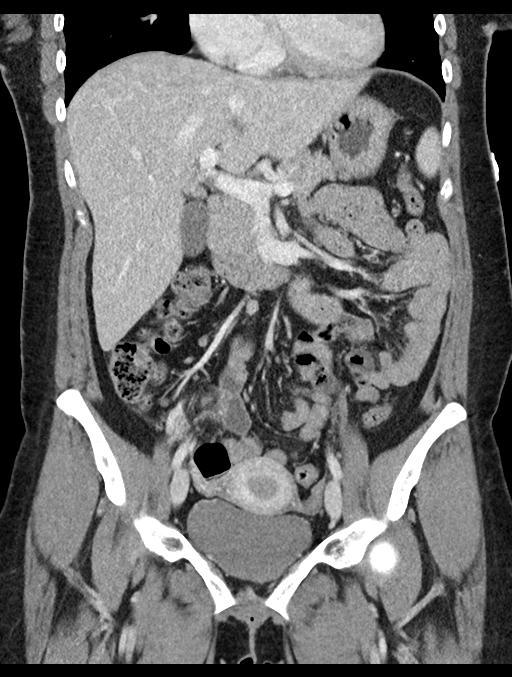
[im 41/73  soft-tissue]
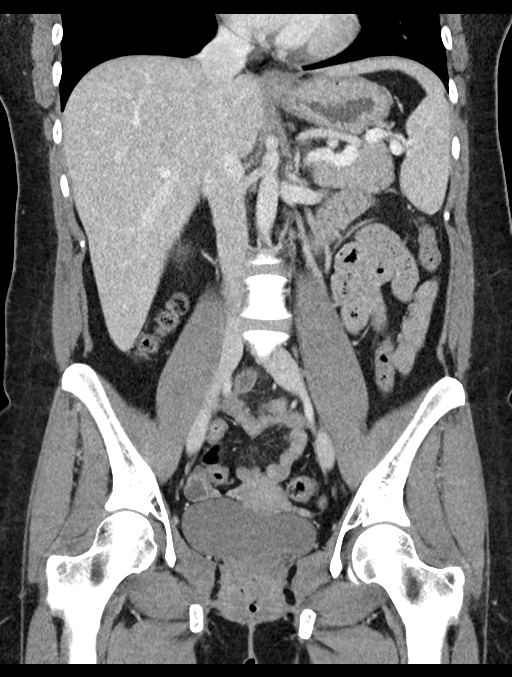

[16 of 46 positions shown; findings below may reference images not displayed]

FINDINGS: Lower chest: No significant pulmonary nodules or acute consolidative
airspace disease.

Hepatobiliary: Normal liver size. No liver mass. Normal gallbladder
with no radiopaque cholelithiasis. No biliary ductal dilatation.

Pancreas: Normal, with no mass or duct dilation.

Spleen: Normal size. No mass.

Adrenals/Urinary Tract: Normal adrenals. Normal kidneys with no
hydronephrosis and no renal mass. Normal bladder.

Stomach/Bowel: Normal non-distended stomach. Normal caliber small
bowel with no small bowel wall thickening. Appendix is dilated and
thick-walled with periappendiceal fat stranding, compatible with
acute appendicitis.

Appendix: Location: Right lower quadrant

Diameter: 14 mm

Appendicolith: Not present

Mucosal hyper-enhancement: Present

Extraluminal gas: Not present

Periappendiceal collection: Not present

Normal large bowel with no diverticulosis, large bowel wall
thickening or pericolonic fat stranding.

Vascular/Lymphatic: Normal caliber abdominal aorta. Patent portal,
splenic, hepatic and renal veins. No pathologically enlarged lymph
nodes in the abdomen or pelvis.

Reproductive: Grossly normal uterus.  No adnexal mass.

Other: No pneumoperitoneum, ascites or focal fluid collection.

Musculoskeletal: No aggressive appearing focal osseous lesions.
IMPRESSION: Acute appendicitis.  No free air.  No abscess.

## 2021-04-04 ENCOUNTER — Telehealth: Payer: Self-pay

## 2021-04-04 NOTE — Telephone Encounter (Signed)
Approvedtoday April 04, 2021 [facility];Review Type:Prior Auth;Coverage Start Date:03/05/2021;Coverage End Date:04/04/2022; Air Duo was approved and sent to pharmacy. Patient will be made aware.

## 2021-05-31 ENCOUNTER — Ambulatory Visit: Payer: Commercial Managed Care - PPO | Admitting: Allergy

## 2021-06-02 ENCOUNTER — Ambulatory Visit: Payer: Commercial Managed Care - PPO | Admitting: Allergy

## 2021-07-17 ENCOUNTER — Ambulatory Visit: Payer: Commercial Managed Care - PPO | Admitting: Family

## 2021-08-19 NOTE — Patient Instructions (Addendum)
Moderate persistent asthma Continue AirDuo 232/14- using one puff twice a day to help prevent cough and wheeze. Continue Singulair 10 mg once a day to help prevent cough and wheeze. Continue albuterol 2 puffs every 4 hours as needed for cough, wheeze, tightness in chest, or shortness of breath. Asthma control goals:  Full participation in all desired activities (may need albuterol before activity) Albuterol use two time or less a week on average (not counting use with activity) Cough interfering with sleep two time or less a month Oral steroids no more than once a year No hospitalizations  Allergic rhinitis ( skin test positive on 12/25/2018 to: dust mite, cat, dog, horse, borderline to mold) Continue Claritin 10 mg once a day as needed for a runny nose. Remember to rotate to a different antihistamine about every 3 months. Some examples of over the counter antihistamines include Zyrtec (cetirizine), Xyzal (levocetirizine), Allegra (fexofenadine), and Claritin (loratidine).  Continue saline rinse as needed  Drug reaction (broke out in hives as a child after amoxicillin) Continue to avoid. Consider oral graded penicillin challenge in the future  Call the clinic if this treatment plan is not working well for you  Follow up in 6 months or sooner if needed.

## 2021-08-21 ENCOUNTER — Other Ambulatory Visit: Payer: Self-pay

## 2021-08-21 ENCOUNTER — Encounter: Payer: Self-pay | Admitting: Family

## 2021-08-21 ENCOUNTER — Ambulatory Visit (INDEPENDENT_AMBULATORY_CARE_PROVIDER_SITE_OTHER): Payer: Commercial Managed Care - PPO | Admitting: Family

## 2021-08-21 VITALS — BP 122/84 | HR 84 | Temp 98.4°F | Resp 16 | Ht 60.0 in | Wt 167.8 lb

## 2021-08-21 DIAGNOSIS — T50905D Adverse effect of unspecified drugs, medicaments and biological substances, subsequent encounter: Secondary | ICD-10-CM

## 2021-08-21 DIAGNOSIS — J302 Other seasonal allergic rhinitis: Secondary | ICD-10-CM

## 2021-08-21 DIAGNOSIS — J3089 Other allergic rhinitis: Secondary | ICD-10-CM

## 2021-08-21 DIAGNOSIS — J4540 Moderate persistent asthma, uncomplicated: Secondary | ICD-10-CM

## 2021-08-21 MED ORDER — ALBUTEROL SULFATE HFA 108 (90 BASE) MCG/ACT IN AERS
INHALATION_SPRAY | RESPIRATORY_TRACT | 1 refills | Status: DC
Start: 2021-08-21 — End: 2022-10-04

## 2021-08-21 MED ORDER — MONTELUKAST SODIUM 10 MG PO TABS: ORAL_TABLET | ORAL | 5 refills | Status: AC

## 2021-08-21 MED ORDER — AIRDUO DIGIHALER 232-14 MCG/ACT IN AEPB: 1.0000 | INHALATION_SPRAY | Freq: Two times a day (BID) | RESPIRATORY_TRACT | 5 refills | Status: DC

## 2021-08-21 NOTE — Progress Notes (Signed)
[address] [place] [address] Dept: [phone]  FOLLOW UP NOTE  Patient ID: Nicole Collins, female    DOB: 1986-02-25  Age: 35 y.o. MRN: [id number] Date of Office Visit: 08/21/2021  Assessment  Chief Complaint: Asthma (Follow up - 6 mth f/u   Everything is going good. Patient states only used emergency inhaler twice since last visit.)  HPI Nicole Collins is a 35 year old female who presents today for follow-up of moderate persistent asthma without complication, seasonal and perennial allergic rhinitis, and adverse effect of drug.  She was last seen on December 01, 2020 by Thermon Leyland, FNP.  Since her last office visit she has not had any new diagnoses or surgeries.  Moderate persistent asthma is reported as controlled with AirDuo 232/14 mcg using 1 puff twice a day, Singulair 10 mg once a day, and albuterol as needed.  She denies any coughing, wheezing, tightness in her chest, shortness of breath, nocturnal awakenings due to breathing problems.  Since her last office visit she has not required any systemic steroids or made any trips to the emergency room or urgent care due to breathing problems.  She has used her albuterol inhaler maybe twice since we last saw her.  Allergic rhinitis is reported as controlled with Singulair 10 mg once a day and saline rinse as needed.  She has not needed an antihistamine since starting Singulair 10 mg once a day.  She denies rhinorrhea, nasal congestion, and postnasal drip.  She has not had any sinus infections since we last saw her.  She continues to avoid amoxicillin.  She reports as a child she broke out in hives.   Drug Allergies:  Allergies  Allergen Reactions   Amoxicillin     REACTION: Hives and swelling   Penicillins Rash    Has patient had a PCN reaction causing immediate rash, facial/tongue/throat swelling, SOB or lightheadedness with hypotension: Yes Has patient had a PCN reaction causing severe rash involving mucus membranes or  skin necrosis: Yes Has patient had a PCN reaction that required hospitalization no Has patient had a PCN reaction occurring within the last 10 years: No If all of the above answers are "NO", then may proceed with Cephalosporin use.    Review of Systems: Review of Systems  Constitutional:  Negative for chills and fever.  HENT:         Denies rhinorrhea, nasal congestion, and postnasal drip  Eyes:        Denies itchy watery eyes  Respiratory:  Negative for cough, shortness of breath and wheezing.   Cardiovascular:  Negative for chest pain and palpitations.  Gastrointestinal:        Denies heartburn and reflux symptoms  Genitourinary:  Negative for frequency.  Skin:  Negative for itching and rash.  Neurological:  Positive for headaches.       Reports occasional headaches  Endo/Heme/Allergies:  Positive for environmental allergies.    Physical Exam: BP 122/84   Pulse 84   Temp 98.4 F (36.9 C)   Resp 16   Ht 5' (1.524 m)   Wt 167 lb 12.8 oz (76.1 kg)   SpO2 95%   BMI 32.77 kg/m    Physical Exam Constitutional:      Appearance: Normal appearance.  HENT:     Head: Normocephalic and atraumatic.     Comments: Pharynx normal, eyes normal, ears normal, nose normal    Right Ear: Tympanic membrane, ear canal and external ear normal.     Left  Ear: Tympanic membrane, ear canal and external ear normal.     Nose: Nose normal.     Mouth/Throat:     Mouth: Mucous membranes are moist.     Pharynx: Oropharynx is clear.  Eyes:     Conjunctiva/sclera: Conjunctivae normal.  Cardiovascular:     Rate and Rhythm: Regular rhythm.     Heart sounds: Normal heart sounds.  Pulmonary:     Effort: Pulmonary effort is normal.     Breath sounds: Normal breath sounds.     Comments: Lungs clear to auscultation Musculoskeletal:     Cervical back: Neck supple.  Skin:    General: Skin is warm.  Neurological:     Mental Status: She is alert and oriented to person, place, and time.  Psychiatric:         Mood and Affect: Mood normal.        Behavior: Behavior normal.        Thought Content: Thought content normal.        Judgment: Judgment normal.    Diagnostics: FVC 3.10 L, FEV1 2.25 L.  Predicted FVC 3.25 L, predicted FEV1 2.73 L.  Spirometry indicates normal respiratory function.  Assessment and Plan: 1. Moderate persistent asthma without complication   2. Seasonal and perennial allergic rhinitis   3. Adverse effect of drug, subsequent encounter     Meds ordered this encounter  Medications   montelukast (SINGULAIR) 10 MG tablet    Sig: TAKE 1 TABLET BY MOUTH EVERYDAY AT BEDTIME    Dispense:  30 tablet    Refill:  5   Fluticasone-Salmeterol,sensor, (AIRDUO DIGIHALER) 232-14 MCG/ACT AEPB    Sig: Inhale 1 puff into the lungs in the morning and at bedtime.    Dispense:  1 each    Refill:  5   albuterol (VENTOLIN HFA) 108 (90 Base) MCG/ACT inhaler    Sig: INHALE 2 PUFFS INTO THE LUNGS EVERY 6 HOURS AS NEEDED FOR WHEEZE OR SHORTNESS OF BREATH    Dispense:  8.5 each    Refill:  1     Patient Instructions  Moderate persistent asthma Continue AirDuo 232/14- using one puff twice a day to help prevent cough and wheeze. Continue Singulair 10 mg once a day to help prevent cough and wheeze. Continue albuterol 2 puffs every 4 hours as needed for cough, wheeze, tightness in chest, or shortness of breath. Asthma control goals:  Full participation in all desired activities (may need albuterol before activity) Albuterol use two time or less a week on average (not counting use with activity) Cough interfering with sleep two time or less a month Oral steroids no more than once a year No hospitalizations  Allergic rhinitis ( skin test positive on 12/25/2018 to: dust mite, cat, dog, horse, borderline to mold) Continue Claritin 10 mg once a day as needed for a runny nose. Remember to rotate to a different antihistamine about every 3 months. Some examples of over the counter  antihistamines include Zyrtec (cetirizine), Xyzal (levocetirizine), Allegra (fexofenadine), and Claritin (loratidine).  Continue saline rinse as needed  Drug reaction (broke out in hives as a child after amoxicillin) Continue to avoid. Consider oral graded penicillin challenge in the future  Call the clinic if this treatment plan is not working well for you  Follow up in 6 months or sooner if needed.  Return in about 6 months (around 02/18/2022), or if symptoms worsen or fail to improve.    Thank you for the opportunity to care  for this patient.  Please do not hesitate to contact me with questions.  Althea Charon, FNP [facility]

## 2022-02-18 NOTE — Progress Notes (Deleted)
Follow Up Note  RE: Keyonda Bickle MRN: [id number] DOB: 09/09/1986 Date of Office Visit: 02/19/2022  Referring provider: Blair Heys, MD Primary care provider: Blair Heys, MD  Chief Complaint: No chief complaint on file.  History of Present Illness: I had the pleasure of seeing Moon Budde for a follow up visit at the [facility] on 02/18/2022. She is a 36 y.o. female, who is being followed for asthma, allergic rhinitis and adverse drug reaction. Her previous allergy office visit was on 08/21/2021 with Nehemiah Settle FNP. Today is a regular follow up visit.  Moderate persistent asthma Continue AirDuo 232/14- using one puff twice a day to help prevent cough and wheeze. Continue Singulair 10 mg once a day to help prevent cough and wheeze. Continue albuterol 2 puffs every 4 hours as needed for cough, wheeze, tightness in chest, or shortness of breath. Asthma control goals:  Full participation in all desired activities (may need albuterol before activity) Albuterol use two time or less a week on average (not counting use with activity) Cough interfering with sleep two time or less a month Oral steroids no more than once a year No hospitalizations   Allergic rhinitis ( skin test positive on 12/25/2018 to: dust mite, cat, dog, horse, borderline to mold) Continue Claritin 10 mg once a day as needed for a runny nose. Remember to rotate to a different antihistamine about every 3 months. Some examples of over the counter antihistamines include Zyrtec (cetirizine), Xyzal (levocetirizine), Allegra (fexofenadine), and Claritin (loratidine).  Continue saline rinse as needed   Drug reaction (broke out in hives as a child after amoxicillin) Continue to avoid. Consider oral graded penicillin challenge in the future  Assessment and Plan: Kourtnie is a 36 y.o. female with: No problem-specific Assessment & Plan notes found for this encounter.  No follow-ups on  file.  No orders of the defined types were placed in this encounter.  Lab Orders  No laboratory test(s) ordered today    Diagnostics: Spirometry:  Tracings reviewed. Her effort: {Blank single:19197::"Good reproducible efforts.","It was hard to get consistent efforts and there is a question as to whether this reflects a maximal maneuver.","Poor effort, data can not be interpreted."} FVC: ***L FEV1: ***L, ***% predicted FEV1/FVC ratio: ***% Interpretation: {Blank single:19197::"Spirometry consistent with mild obstructive disease","Spirometry consistent with moderate obstructive disease","Spirometry consistent with severe obstructive disease","Spirometry consistent with possible restrictive disease","Spirometry consistent with mixed obstructive and restrictive disease","Spirometry uninterpretable due to technique","Spirometry consistent with normal pattern","No overt abnormalities noted given today's efforts"}.  Please see scanned spirometry results for details.  Skin Testing: {Blank single:19197::"Select foods","Environmental allergy panel","Environmental allergy panel and select foods","Food allergy panel","None","Deferred due to recent antihistamines use"}. *** Results discussed with patient/family.   Medication List:  Current Outpatient Medications  Medication Sig Dispense Refill  . acetaminophen (TYLENOL) 500 MG tablet Take 2 tablets (1,000 mg total) by mouth every 6 (six) hours. 30 tablet 0  . albuterol (VENTOLIN HFA) 108 (90 Base) MCG/ACT inhaler INHALE 2 PUFFS INTO THE LUNGS EVERY 6 HOURS AS NEEDED FOR WHEEZE OR SHORTNESS OF BREATH 8.5 each 1  . Fluticasone-Salmeterol,sensor, (AIRDUO DIGIHALER) 232-14 MCG/ACT AEPB Inhale 1 puff into the lungs in the morning and at bedtime. 1 each 5  . ibuprofen (ADVIL,MOTRIN) 600 MG tablet Take 1 tablet (600 mg total) by mouth every 6 (six) hours as needed for moderate pain. 30 tablet 0  . montelukast (SINGULAIR) 10 MG tablet TAKE 1 TABLET BY MOUTH  EVERYDAY AT BEDTIME 30 tablet 5  .  Prenatal Vit-Fe Fumarate-FA (PRENATAL MULTIVITAMIN) TABS tablet Take 1 tablet by mouth at bedtime.      No current facility-administered medications for this visit.   Allergies: Allergies  Allergen Reactions  . Amoxicillin     REACTION: Hives and swelling  . Penicillins Rash    Has patient had a PCN reaction causing immediate rash, facial/tongue/throat swelling, SOB or lightheadedness with hypotension: Yes Has patient had a PCN reaction causing severe rash involving mucus membranes or skin necrosis: Yes Has patient had a PCN reaction that required hospitalization no Has patient had a PCN reaction occurring within the last 10 years: No If all of the above answers are "NO", then may proceed with Cephalosporin use.   I reviewed her past medical history, social history, family history, and environmental history and no significant changes have been reported from her previous visit.  Review of Systems  Constitutional:  Negative for appetite change, chills, fever and unexpected weight change.  HENT:  Negative for congestion and rhinorrhea.   Eyes:  Negative for itching.  Respiratory:  Negative for cough, chest tightness, shortness of breath and wheezing.   Cardiovascular:  Negative for chest pain.  Gastrointestinal:  Negative for abdominal pain.  Genitourinary:  Negative for difficulty urinating.  Skin:  Negative for rash.  Allergic/Immunologic: Positive for environmental allergies. Negative for food allergies.  Neurological:  Negative for headaches.   Objective: There were no vitals taken for this visit. There is no height or weight on file to calculate BMI. Physical Exam Vitals and nursing note reviewed.  Constitutional:      Appearance: Normal appearance. She is well-developed.  HENT:     Head: Normocephalic and atraumatic.     Right Ear: External ear normal.     Left Ear: External ear normal.     Nose: Nose normal.     Mouth/Throat:      Mouth: Mucous membranes are moist.     Pharynx: Oropharynx is clear.  Eyes:     Conjunctiva/sclera: Conjunctivae normal.  Cardiovascular:     Rate and Rhythm: Normal rate and regular rhythm.     Heart sounds: Normal heart sounds. No murmur heard.   No friction rub. No gallop.  Pulmonary:     Effort: Pulmonary effort is normal.     Breath sounds: Normal breath sounds. No wheezing or rales.  Musculoskeletal:     Cervical back: Neck supple.  Lymphadenopathy:     Cervical: No cervical adenopathy.  Skin:    General: Skin is warm.     Findings: No rash.  Neurological:     Mental Status: She is alert and oriented to person, place, and time.  Psychiatric:        Behavior: Behavior normal.  Previous notes and tests were reviewed. The plan was reviewed with the patient/family, and all questions/concerned were addressed.  It was my pleasure to see Allaya today and participate in her care. Please feel free to contact me with any questions or concerns.  Sincerely,  Wyline Mood, DO Allergy & Immunology  [facility] office: [phone] [facility] office: [phone]

## 2022-02-19 ENCOUNTER — Ambulatory Visit: Payer: Self-pay | Admitting: Allergy

## 2022-02-19 DIAGNOSIS — J309 Allergic rhinitis, unspecified: Secondary | ICD-10-CM

## 2022-06-21 ENCOUNTER — Telehealth: Payer: Self-pay | Admitting: Family

## 2022-06-21 MED ORDER — AIRDUO DIGIHALER 232-14 MCG/ACT IN AEPB: 1.0000 | INHALATION_SPRAY | Freq: Two times a day (BID) | RESPIRATORY_TRACT | 0 refills | Status: DC

## 2022-06-21 NOTE — Telephone Encounter (Signed)
Sent in the curtesy airduo to [facility] [address]

## 2022-06-21 NOTE — Telephone Encounter (Signed)
Patient is in need of a refill for her Airduo. Patient was last seen 07/2021. Advised patient she needed an appointment - patient is scheduled for 06-28-2022.   Patient is requesting courtesy refill for Airduo  [facility] [address]  Best contact number: [phone]

## 2022-06-22 NOTE — Telephone Encounter (Signed)
AirDuo Digihaler 232-14 MCG/ACT PA...  KEY: [id number] PA Case ID: [id number] - Rx#: [id number] created: 06/22/22

## 2022-06-26 NOTE — Telephone Encounter (Signed)
AirDuo Digihaler 232-14 MCG/ACT PA....  Denied: Formulary alternatives are: Advair Diskus, Advair HFA, Breo Ellipta, Symbicort. (Requirement: 3 in a class with 3 or more alternatives, 2 in a class with 2 alternatives, or 1 in a class with only 1 alternative.)  Forwarding message to provider for next step.

## 2022-06-27 MED ORDER — ADVAIR HFA 230-21 MCG/ACT IN AERO: INHALATION_SPRAY | RESPIRATORY_TRACT | 0 refills | Status: DC

## 2022-06-27 NOTE — Telephone Encounter (Addendum)
Called patient - DPR verified - LMOVM regarding courtesy medication refill and reminding patient to keep her appt on tomorrow, Thursday, 06/28/22 @ 4 pm with Thurston Hole for medication refills.  Electronically sent in Advair HFA 230/21 mcg 2 puffs 2 times a day with spacer. Rinse mouth out afterwards. RF: 0  to pharmacy.

## 2022-06-27 NOTE — Progress Notes (Signed)
[address] [place] [address] Dept: [phone]  FOLLOW UP NOTE  Patient ID: Nicole Collins, female    DOB: 13-Jul-1986  Age: 36 y.o. MRN: [id number] Date of Office Visit: 06/28/2022  Assessment  Chief Complaint: Asthma  HPI Nicole Collins is a 36 year old female who presents to the clinic for a follow up visit. She was last seen in this clinic om 07/24/2021 by Nicole Settle, FNP, for evaluation of asthma, allergic rhintiis, and drug allergy to amoxicillin.  At today's visit, she reports her asthma has been moderately well controlled with symptoms occurring over the last couple of weeks including shortness of breath with activity, wheeze with activity, and dry cough.  She reports these symptoms have occurred with poor air quality, humidity, and after mowing the lawn.  Other than the last 2 weeks, she reports that she generally has an " asthma attack" and needs to use her rescue inhaler about once a month after laughing or activity and resolving with albuterol.  She continues montelukast 10 mg once a day, AirDuo 232-1 puff once a day, and albuterol as needed with relief of symptoms.  She reports that she has been low on the air duo and only using this medication in limited capacity in order to stretch it farther.  Allergic rhinitis is reported as moderately well controlled with as needed Flonase, Claritin, and saline.  She does report an increase in raspiness in her voice over the last 2 weeks.  She denies throat pain. Her last environmental allergy skin testing was on 12/25/2018 and was positive to dust mites, cat, dog, horse, and mold.  She continues to avoid amoxicillin and penicillin containing drugs.  Her current medications are listed in the chart.   Drug Allergies:  Allergies  Allergen Reactions   Amoxicillin     REACTION: Hives and swelling   Penicillins Rash    Has patient had a PCN reaction causing immediate rash, facial/tongue/throat swelling, SOB or lightheadedness with  hypotension: Yes Has patient had a PCN reaction causing severe rash involving mucus membranes or skin necrosis: Yes Has patient had a PCN reaction that required hospitalization no Has patient had a PCN reaction occurring within the last 10 years: No If all of the above answers are "NO", then may proceed with Cephalosporin use.    Physical Exam: BP 100/62   Pulse 80   Temp 98.2 F (36.8 C) (Temporal)   Resp 18   Ht 5' (1.524 m)   Wt 174 lb (78.9 kg)   SpO2 99%   BMI 33.98 kg/m    Physical Exam Vitals reviewed.  Constitutional:      Appearance: Normal appearance.  HENT:     Head: Normocephalic and atraumatic.     Right Ear: Tympanic membrane normal.     Left Ear: Tympanic membrane normal.     Nose:     Comments: Oral nares slightly erythematous with clear nasal drainage noted.  Pharynx slightly erythematous with no exudate.  Ears normal.  Eyes normal. Eyes:     Conjunctiva/sclera: Conjunctivae normal.  Cardiovascular:     Rate and Rhythm: Normal rate and regular rhythm.     Heart sounds: Normal heart sounds. No murmur heard. Pulmonary:     Effort: Pulmonary effort is normal.     Breath sounds: Normal breath sounds.     Comments: Lungs clear to auscultation Musculoskeletal:        General: Normal range of motion.     Cervical back: Normal  range of motion and neck supple.  Skin:    General: Skin is warm and dry.  Neurological:     Mental Status: She is alert and oriented to person, place, and time.  Psychiatric:        Mood and Affect: Mood normal.        Behavior: Behavior normal.        Thought Content: Thought content normal.        Judgment: Judgment normal.     Diagnostics: FVC 3.10, 95% of predicted value.  FEV1 2.41, 88.9% of predicted value.  Spirometry indicates normal ventilatory function.  Assessment and Plan: 1. Moderate persistent asthma without complication   2. Seasonal and perennial allergic rhinitis   3. Adverse effect of drug, subsequent  encounter     Meds ordered this encounter  Medications   fluticasone-salmeterol (ADVAIR DISKUS) 250-50 MCG/ACT AEPB    Sig: Inhale 1 puff into the lungs in the morning and at bedtime. Rinse mouth after use.    Dispense:  60 each    Refill:  5    Please dispense insurance covered brand    Patient Instructions  Moderate persistent asthma Begin Advair 230-2 puffs twice a day to help prevent cough and wheeze. Continue Singulair 10 mg once a day to help prevent cough and wheeze. Continue albuterol 2 puffs every 4 hours as needed for cough, wheeze, tightness in chest, or shortness of breath. Asthma control goals:  Full participation in all desired activities (may need albuterol before activity) Albuterol use two time or less a week on average (not counting use with activity) Cough interfering with sleep two time or less a month Oral steroids no more than once a year No hospitalizations  Allergic rhinitis ( skin test positive on 12/25/2018 to: dust mite, cat, dog, horse, borderline to mold) Continue Claritin 10 mg once a day as needed for a runny nose. Remember to rotate to a different antihistamine about every 3 months. Some examples of over the counter antihistamines include Zyrtec (cetirizine), Xyzal (levocetirizine), Allegra (fexofenadine), and Claritin (loratidine).  Continue saline rinse as needed  Drug reaction (broke out in hives as a child after amoxicillin) Continue to avoid. Consider testing in the future  Call the clinic if this treatment plan is not working well for you  Follow up in 6 months or sooner if needed.   Return in about 6 months (around 12/27/2022), or if symptoms worsen or fail to improve.    Thank you for the opportunity to care for this patient.  Please do not hesitate to contact me with questions.  Nicole Leyland, FNP [facility]

## 2022-06-27 NOTE — Telephone Encounter (Signed)
Please let Nicole Collins know that her AirDuo 232-14 mcg is not covered by her insurance. Please change to Advair HFA 230/21 mcg taking 2 puffs twice a day with spacer. Rinse mouth out afterwards. Send with no refills. Make sure she keeps her appointment with Thurston Hole tomorrow at 4 PM for further refills.  Routing to Thurston Hole so she is up to date on med change.

## 2022-06-27 NOTE — Patient Instructions (Addendum)
Moderate persistent asthma Begin Advair 230-2 puffs twice a day to help prevent cough and wheeze. Continue Singulair 10 mg once a day to help prevent cough and wheeze. Continue albuterol 2 puffs every 4 hours as needed for cough, wheeze, tightness in chest, or shortness of breath. Asthma control goals:  Full participation in all desired activities (may need albuterol before activity) Albuterol use two time or less a week on average (not counting use with activity) Cough interfering with sleep two time or less a month Oral steroids no more than once a year No hospitalizations  Allergic rhinitis ( skin test positive on 12/25/2018 to: dust mite, cat, dog, horse, borderline to mold) Continue Claritin 10 mg once a day as needed for a runny nose. Remember to rotate to a different antihistamine about every 3 months. Some examples of over the counter antihistamines include Zyrtec (cetirizine), Xyzal (levocetirizine), Allegra (fexofenadine), and Claritin (loratidine).  Continue saline rinse as needed  Drug reaction (broke out in hives as a child after amoxicillin) Continue to avoid. Consider testing in the future  Call the clinic if this treatment plan is not working well for you  Follow up in 6 months or sooner if needed.   Control of Dust Mite Allergen Dust mites play a major role in allergic asthma and rhinitis. They occur in environments with high humidity wherever human skin is found. Dust mites absorb humidity from the atmosphere (ie, they do not drink) and feed on organic matter (including shed human and animal skin). Dust mites are a microscopic type of insect that you cannot see with the naked eye. High levels of dust mites have been detected from mattresses, pillows, carpets, upholstered furniture, bed covers, clothes, soft toys and any woven material. The principal allergen of the dust mite is found in its feces. A gram of dust may contain 1,000 mites and 250,000 fecal particles. Mite  antigen is easily measured in the air during house cleaning activities. Dust mites do not bite and do not cause harm to humans, other than by triggering allergies/asthma.  Ways to decrease your exposure to dust mites in your home:  1. Encase mattresses, box springs and pillows with a mite-impermeable barrier or cover  2. Wash sheets, blankets and drapes weekly in hot water (130 F) with detergent and dry them in a dryer on the hot setting.  3. Have the room cleaned frequently with a vacuum cleaner and a damp dust-mop. For carpeting or rugs, vacuuming with a vacuum cleaner equipped with a high-efficiency particulate air (HEPA) filter. The dust mite allergic individual should not be in a room which is being cleaned and should wait 1 hour after cleaning before going into the room.  4. Do not sleep on upholstered furniture (eg, couches).  5. If possible removing carpeting, upholstered furniture and drapery from the home is ideal. Horizontal blinds should be eliminated in the rooms where the person spends the most time (bedroom, study, television room). Washable vinyl, roller-type shades are optimal.  6. Remove all non-washable stuffed toys from the bedroom. Wash stuffed toys weekly like sheets and blankets above.  7. Reduce indoor humidity to less than 50%. Inexpensive humidity monitors can be purchased at most hardware stores. Do not use a humidifier as can make the problem worse and are not recommended.  Control of Dog or Cat Allergen Avoidance is the best way to manage a dog or cat allergy. If you have a dog or cat and are allergic to dog or cats, consider  removing the dog or cat from the home. If you have a dog or cat but don't want to find it a new home, or if your family wants a pet even though someone in the household is allergic, here are some strategies that may help keep symptoms at bay:  Keep the pet out of your bedroom and restrict it to only a few rooms. Be advised that keeping the dog  or cat in only one room will not limit the allergens to that room. Don't pet, hug or kiss the dog or cat; if you do, wash your hands with soap and water. High-efficiency particulate air (HEPA) cleaners run continuously in a bedroom or living room can reduce allergen levels over time. Regular use of a high-efficiency vacuum cleaner or a central vacuum can reduce allergen levels. Giving your dog or cat a bath at least once a week can reduce airborne allergen.  Control of Mold Allergen Mold and fungi can grow on a variety of surfaces provided certain temperature and moisture conditions exist.  Outdoor molds grow on plants, decaying vegetation and soil.  The major outdoor mold, Alternaria and Cladosporium, are found in very high numbers during hot and dry conditions.  Generally, a late Summer - Fall peak is seen for common outdoor fungal spores.  Rain will temporarily lower outdoor mold spore count, but counts rise rapidly when the rainy period ends.  The most important indoor molds are Aspergillus and Penicillium.  Dark, humid and poorly ventilated basements are ideal sites for mold growth.  The next most common sites of mold growth are the bathroom and the kitchen.  Outdoor [employer] Use air conditioning and keep windows closed Avoid exposure to decaying vegetation. Avoid leaf raking. Avoid grain handling. Consider wearing a face mask if working in moldy areas.  Indoor Mold Control Maintain humidity below 50%. Clean washable surfaces with 5% bleach solution. Remove sources e.g. Contaminated carpets.

## 2022-06-27 NOTE — Addendum Note (Signed)
Addended by: Areta Haber B on: 06/27/2022 12:41 PM   Modules accepted: Orders

## 2022-06-28 ENCOUNTER — Ambulatory Visit: Payer: BC Managed Care – PPO | Admitting: Family Medicine

## 2022-06-28 ENCOUNTER — Encounter: Payer: Self-pay | Admitting: Family Medicine

## 2022-06-28 ENCOUNTER — Telehealth: Payer: Self-pay

## 2022-06-28 VITALS — BP 100/62 | HR 80 | Temp 98.2°F | Resp 18 | Ht 60.0 in | Wt 174.0 lb

## 2022-06-28 DIAGNOSIS — J4540 Moderate persistent asthma, uncomplicated: Secondary | ICD-10-CM | POA: Diagnosis not present

## 2022-06-28 DIAGNOSIS — T50905D Adverse effect of unspecified drugs, medicaments and biological substances, subsequent encounter: Secondary | ICD-10-CM

## 2022-06-28 DIAGNOSIS — J3089 Other allergic rhinitis: Secondary | ICD-10-CM

## 2022-06-28 DIAGNOSIS — J302 Other seasonal allergic rhinitis: Secondary | ICD-10-CM

## 2022-06-28 MED ORDER — FLUTICASONE-SALMETEROL 250-50 MCG/ACT IN AEPB
1.0000 | INHALATION_SPRAY | Freq: Two times a day (BID) | RESPIRATORY_TRACT | 5 refills | Status: DC
Start: 2022-06-28 — End: 2022-12-06

## 2022-06-28 NOTE — Telephone Encounter (Signed)
Thank you :)

## 2022-06-28 NOTE — Telephone Encounter (Signed)
I was able to get a pharmacist the ADVAIR DISKUS is covered by the patient's insurance and will only cost her $10. I notified the patient that the Advair Diskus is ready for pick up at her preferred pharmacy. I also informed her that is she does not see any asthma relief from this inhaler to notify us so that we can add that to her trial/fail list. Patient has tried and failed symbicort and required to try and fail the advair diskus in order to get the Advair HFA 230-21MCG/ACT approved.

## 2022-06-28 NOTE — Telephone Encounter (Signed)
Prior Auth has been completed for the Advair HFA 230-21MCG/ACT aerosol.   [employer] Liner - PA Case ID: [id number] - Rx #: [id number]   I called the pharmacy to see how much the (ADVAIR DISKUS) 250-50 MCG/ACT AEPB would cost if PA was not approved. I was not able to get a pharmacist on the line. I will call back tomorrow. (06/28/22).

## 2022-08-08 ENCOUNTER — Other Ambulatory Visit: Payer: Self-pay | Admitting: Family

## 2022-10-03 ENCOUNTER — Telehealth: Payer: Self-pay | Admitting: Allergy

## 2022-10-03 DIAGNOSIS — R0789 Other chest pain: Secondary | ICD-10-CM | POA: Diagnosis not present

## 2022-10-03 DIAGNOSIS — J4540 Moderate persistent asthma, uncomplicated: Secondary | ICD-10-CM | POA: Diagnosis not present

## 2022-10-03 NOTE — Telephone Encounter (Signed)
Patient called and stated that she woke up with a cough. Patient also states that when she coughs she has a metallic taste in her mouth. Patient states that her chest is hurting and burning and she has a little bit of rumbling in her chest. Patient states that she is unsure if she should see a provider here or go to her PCP. Patients call back number is [phone]

## 2022-10-04 ENCOUNTER — Ambulatory Visit: Payer: BC Managed Care – PPO | Admitting: Family Medicine

## 2022-10-04 ENCOUNTER — Ambulatory Visit (HOSPITAL_COMMUNITY)
Admission: RE | Admit: 2022-10-04 | Discharge: 2022-10-04 | Disposition: A | Discharge status: Home / Self Care | Payer: BC Managed Care – PPO | Source: Ambulatory Visit | Attending: Family Medicine | Admitting: Family Medicine

## 2022-10-04 ENCOUNTER — Encounter: Payer: Self-pay | Admitting: Family Medicine

## 2022-10-04 ENCOUNTER — Telehealth: Payer: Self-pay | Admitting: Family Medicine

## 2022-10-04 VITALS — BP 126/80 | HR 75 | Temp 99.1°F | Resp 16 | Wt 171.1 lb

## 2022-10-04 DIAGNOSIS — R059 Cough, unspecified: Secondary | ICD-10-CM | POA: Diagnosis not present

## 2022-10-04 DIAGNOSIS — Z889 Allergy status to unspecified drugs, medicaments and biological substances status: Secondary | ICD-10-CM | POA: Diagnosis not present

## 2022-10-04 DIAGNOSIS — R051 Acute cough: Secondary | ICD-10-CM

## 2022-10-04 DIAGNOSIS — J4541 Moderate persistent asthma with (acute) exacerbation: Secondary | ICD-10-CM | POA: Diagnosis not present

## 2022-10-04 DIAGNOSIS — J3089 Other allergic rhinitis: Secondary | ICD-10-CM | POA: Diagnosis not present

## 2022-10-04 DIAGNOSIS — J302 Other seasonal allergic rhinitis: Secondary | ICD-10-CM

## 2022-10-04 DIAGNOSIS — T50905D Adverse effect of unspecified drugs, medicaments and biological substances, subsequent encounter: Secondary | ICD-10-CM

## 2022-10-04 DIAGNOSIS — R0602 Shortness of breath: Secondary | ICD-10-CM | POA: Diagnosis not present

## 2022-10-04 DIAGNOSIS — R079 Chest pain, unspecified: Secondary | ICD-10-CM | POA: Diagnosis not present

## 2022-10-04 MED ORDER — MONTELUKAST SODIUM 10 MG PO TABS: 10.0000 mg | ORAL_TABLET | Freq: Every day | ORAL | 1 refills | Status: AC

## 2022-10-04 MED ORDER — PREDNISONE 10 MG PO TABS: ORAL_TABLET | ORAL | 0 refills | Status: AC

## 2022-10-04 MED ORDER — ALBUTEROL SULFATE HFA 108 (90 BASE) MCG/ACT IN AERS: 2.0000 | INHALATION_SPRAY | RESPIRATORY_TRACT | 1 refills | Status: AC | PRN

## 2022-10-04 NOTE — Progress Notes (Signed)
Patient notified of chest xray result. She agrees to take prednisone as prescribed and pretreat with albuterol before using Adviar for the next 3-5 days. She will call the clinic with any fever, worsening of symptoms or if symptoms do not improve.

## 2022-10-04 NOTE — Telephone Encounter (Signed)
Same day appt was scheduled for patient.

## 2022-10-04 NOTE — Telephone Encounter (Signed)
Can you please call to check on this patient? Did she go to the ED? If she needs an appointment please add on to my schedule today. Thank you

## 2022-10-04 NOTE — Progress Notes (Signed)
[address] [place] [address] Dept: [phone]  FOLLOW UP NOTE  Patient ID: Nicole Collins, female    DOB: [birth year]  Age: 36 y.o. MRN: [id number] Date of Office Visit: 10/04/2022  Assessment  Chief Complaint: Cough (Clear sticky mucus - metallic taste while coughing, tired, raspy voice and shortness of breath. )  HPI Nicole Collins is a 36 year old female who presents to the clinic for follow-up visit.  She was last seen in this clinic on 06/28/2022 by Thermon Leyland, FNP, for evaluation of asthma, allergic rhinitis, and penicillin allergy.  At that time, she was started on Advair 230-2 puffs twice a day with a spacer for asthma control.  At today's visit, she reports that she began to experience lung loss shortness of breath with activity and rest at and cough producing thick sticky mucus that began yesterday.  She denies fever, chills, and sick contacts.  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  At today's visit, she reports that she continues to experience shortness of breath with activity and rest and cough that is producing thick sticky mucus which leads to metallic taste in her mouth as well as headache that began yesterday.  She reports dull midsternal pain while coughing.  She continues montelukast 10 mg once a day, Advair 230-2 puffs twice a day, and has used albuterol several times over the last few days with moderate relief in symptoms, however, symptoms returned after a brief interval.  She reports that she went to urgent care yesterday and did not receive any additional treatment orders.  Allergic rhinitis is reported as moderately well controlled with symptoms including clear rhinorrhea, moderate nasal congestion, and some postnasal drainage.  She is not currently using a nasal saline rinse or nasal steroid spray.  Her current medications are listed in the chart.  Drug Allergies:  Allergies  Allergen Reactions   Amoxicillin     REACTION: Hives and swelling    Penicillins Rash    Has patient had a PCN reaction causing immediate rash, facial/tongue/throat swelling, SOB or lightheadedness with hypotension: Yes Has patient had a PCN reaction causing severe rash involving mucus membranes or skin necrosis: Yes Has patient had a PCN reaction that required hospitalization no Has patient had a PCN reaction occurring within the last 10 years: No If all of the above answers are "NO", then may proceed with Cephalosporin use.    Physical Exam: BP 126/80   Pulse 75   Temp 99.1 F (37.3 C)   Resp 16   Wt 171 lb 1.6 oz (77.6 kg)   LMP 09/23/2022 (Exact Date) Comment: shielded  SpO2 98%   BMI 33.42 kg/m    Physical Exam Vitals reviewed.  Constitutional:      Appearance: Normal appearance.  HENT:     Head: Normocephalic and atraumatic.     Right Ear: Tympanic membrane normal.     Left Ear: Tympanic membrane normal.     Nose:     Comments: Bilateral nares edematous and pale with clear nasal drainage noted.  Pharynx slightly erythematous with no exudate.  Ears normal.  Eyes normal. Eyes:     Conjunctiva/sclera: Conjunctivae normal.  Cardiovascular:     Rate and Rhythm: Normal rate and regular rhythm.     Heart sounds: Normal heart sounds. No murmur heard. Pulmonary:     Comments: Bilateral expiratory wheeze.  Patient tachypneic with speech.  No use of accessory muscles noted.  Oxygen saturation remains normal. Musculoskeletal:  General: Normal range of motion.     Cervical back: Normal range of motion and neck supple.  Skin:    General: Skin is warm.  Neurological:     Mental Status: She is alert and oriented to person, place, and time.  Psychiatric:        Mood and Affect: Mood normal.        Behavior: Behavior normal.        Thought Content: Thought content normal.        Judgment: Judgment normal.     Diagnostics: Deferred due to shortness of breath and cough  Assessment and Plan: 1. Acute cough   2. Moderate persistent  asthma with acute exacerbation   3. Seasonal and perennial allergic rhinitis   4. Adverse effect of drug, subsequent encounter     Meds ordered this encounter  Medications   predniSONE (DELTASONE) 10 MG tablet    Sig: Begin prednisone 10 mg tablets. Take 2 tablets twice a day for 3 days, then take 2 tablets once a day for 1 day, then take 1 tablet on the 5th day, then stop    Dispense:  15 tablet    Refill:  0    Patient Instructions  Moderate persistent asthma with cough and acute exacerbation Get a chest xray. We will call you when the results become available Begin prednisone 10 mg tablets. Take 2 tablets twice a day for 3 days, then take 2 tablets once a day for 1 day, then take 1 tablet on the 5th day, then stop Continue Advair 230-2 puffs twice a day to help prevent cough and wheeze. Continue Singulair 10 mg once a day to help prevent cough and wheeze. Continue albuterol 2 puffs every 4 hours as needed for cough, wheeze, tightness in chest, or shortness of breath. Asthma control goals:  Full participation in all desired activities (may need albuterol before activity) Albuterol use two time or less a week on average (not counting use with activity) Cough interfering with sleep two time or less a month Oral steroids no more than once a year No hospitalizations  Allergic rhinitis ( skin test positive on 12/25/2018 to: dust mite, cat, dog, horse, borderline to mold) Continue Claritin 10 mg once a day as needed for a runny nose. Remember to rotate to a different antihistamine about every 3 months. Some examples of over the counter antihistamines include Zyrtec (cetirizine), Xyzal (levocetirizine), Allegra (fexofenadine), and Claritin (loratidine).  Continue saline rinse as needed  Drug reaction (broke out in hives as a child after amoxicillin) Continue to avoid. Consider testing in the future  Call the clinic if this treatment plan is not working well for you  Follow up in 2  weeks or sooner if needed.   Return in about 2 weeks (around 10/18/2022), or if symptoms worsen or fail to improve.    Thank you for the opportunity to care for this patient.  Please do not hesitate to contact me with questions.  Thermon Leyland, FNP [facility]

## 2022-10-04 NOTE — Patient Instructions (Addendum)
Moderate persistent asthma Get a chest xray. We will call you when the results become available Begin prednisone 10 mg tablets. Take 2 tablets twice a day for 3 days, then take 2 tablets once a day for 1 day, then take 1 tablet on the 5th day, then stop Continue Advair 230-2 puffs twice a day to help prevent cough and wheeze. Continue Singulair 10 mg once a day to help prevent cough and wheeze. Continue albuterol 2 puffs every 4 hours as needed for cough, wheeze, tightness in chest, or shortness of breath. Asthma control goals:  Full participation in all desired activities (may need albuterol before activity) Albuterol use two time or less a week on average (not counting use with activity) Cough interfering with sleep two time or less a month Oral steroids no more than once a year No hospitalizations  Allergic rhinitis ( skin test positive on 12/25/2018 to: dust mite, cat, dog, horse, borderline to mold) Continue Claritin 10 mg once a day as needed for a runny nose. Remember to rotate to a different antihistamine about every 3 months. Some examples of over the counter antihistamines include Zyrtec (cetirizine), Xyzal (levocetirizine), Allegra (fexofenadine), and Claritin (loratidine).  Continue saline rinse as needed  Drug reaction (broke out in hives as a child after amoxicillin) Continue to avoid. Consider testing in the future  Call the clinic if this treatment plan is not working well for you  Follow up in 2 weeks or sooner if needed.   Control of Dust Mite Allergen Dust mites play a major role in allergic asthma and rhinitis. They occur in environments with high humidity wherever human skin is found. Dust mites absorb humidity from the atmosphere (ie, they do not drink) and feed on organic matter (including shed human and animal skin). Dust mites are a microscopic type of insect that you cannot see with the naked eye. High levels of dust mites have been detected from mattresses,  pillows, carpets, upholstered furniture, bed covers, clothes, soft toys and any woven material. The principal allergen of the dust mite is found in its feces. A gram of dust may contain 1,000 mites and 250,000 fecal particles. Mite antigen is easily measured in the air during house cleaning activities. Dust mites do not bite and do not cause harm to humans, other than by triggering allergies/asthma.  Ways to decrease your exposure to dust mites in your home:  1. Encase mattresses, box springs and pillows with a mite-impermeable barrier or cover  2. Wash sheets, blankets and drapes weekly in hot water (130 F) with detergent and dry them in a dryer on the hot setting.  3. Have the room cleaned frequently with a vacuum cleaner and a damp dust-mop. For carpeting or rugs, vacuuming with a vacuum cleaner equipped with a high-efficiency particulate air (HEPA) filter. The dust mite allergic individual should not be in a room which is being cleaned and should wait 1 hour after cleaning before going into the room.  4. Do not sleep on upholstered furniture (eg, couches).  5. If possible removing carpeting, upholstered furniture and drapery from the home is ideal. Horizontal blinds should be eliminated in the rooms where the person spends the most time (bedroom, study, television room). Washable vinyl, roller-type shades are optimal.  6. Remove all non-washable stuffed toys from the bedroom. Wash stuffed toys weekly like sheets and blankets above.  7. Reduce indoor humidity to less than 50%. Inexpensive humidity monitors can be purchased at most hardware stores. Do not  use a humidifier as can make the problem worse and are not recommended.  Control of Dog or Cat Allergen Avoidance is the best way to manage a dog or cat allergy. If you have a dog or cat and are allergic to dog or cats, consider removing the dog or cat from the home. If you have a dog or cat but don't want to find it a new home, or if your  family wants a pet even though someone in the household is allergic, here are some strategies that may help keep symptoms at bay:  Keep the pet out of your bedroom and restrict it to only a few rooms. Be advised that keeping the dog or cat in only one room will not limit the allergens to that room. Don't pet, hug or kiss the dog or cat; if you do, wash your hands with soap and water. High-efficiency particulate air (HEPA) cleaners run continuously in a bedroom or living room can reduce allergen levels over time. Regular use of a high-efficiency vacuum cleaner or a central vacuum can reduce allergen levels. Giving your dog or cat a bath at least once a week can reduce airborne allergen.  Control of Mold Allergen Mold and fungi can grow on a variety of surfaces provided certain temperature and moisture conditions exist.  Outdoor molds grow on plants, decaying vegetation and soil.  The major outdoor mold, Alternaria and Cladosporium, are found in very high numbers during hot and dry conditions.  Generally, a late Summer - Fall peak is seen for common outdoor fungal spores.  Rain will temporarily lower outdoor mold spore count, but counts rise rapidly when the rainy period ends.  The most important indoor molds are Aspergillus and Penicillium.  Dark, humid and poorly ventilated basements are ideal sites for mold growth.  The next most common sites of mold growth are the bathroom and the kitchen.  Outdoor [employer] Use air conditioning and keep windows closed Avoid exposure to decaying vegetation. Avoid leaf raking. Avoid grain handling. Consider wearing a face mask if working in moldy areas.  Indoor Mold Control Maintain humidity below 50%. Clean washable surfaces with 5% bleach solution. Remove sources e.g. Contaminated carpets.

## 2022-10-04 NOTE — Telephone Encounter (Signed)
LMOM for patient to call back to discuss chest xray.

## 2022-10-05 ENCOUNTER — Telehealth: Payer: Self-pay | Admitting: *Deleted

## 2022-10-05 NOTE — Telephone Encounter (Signed)
-----   Message from Hetty Blend, FNP sent at 10/05/2022  9:17 AM EST ----- Can you please call this patient and find out how she is breathing? Please make sure she is pre-treating with albuterol before using her asthma maintenance inhaler and she is continuing to take the prednisone. Thank you

## 2022-10-05 NOTE — Telephone Encounter (Signed)
Attempted to call patient to discuss, there was no answer and the mailbox was full. Will need to attempt to call again.

## 2022-10-12 NOTE — Telephone Encounter (Signed)
Thank you :)

## 2022-10-12 NOTE — Telephone Encounter (Signed)
Spoke with patient, informed her tried calling about 7 days ago however her mailbox was full. I apologized that we have not reached back out since then. She was appreciative and didn't realize her mailbox was full. She stated that her breathing is a lot better. She isn't having any pain or burning like before. She stated that she still has a cough however it is a productive cough now where as before it was not. She finished her prednisone about a day or two ago.

## 2022-12-05 ENCOUNTER — Other Ambulatory Visit: Payer: Self-pay | Admitting: Family Medicine

## 2022-12-06 NOTE — Telephone Encounter (Signed)
Wixela 250 is being sent into the pharmacy to replace Advair per Gareth Morgan.

## 2022-12-06 NOTE — Telephone Encounter (Signed)
Any chance for wixela 250-1 puff twice a day please?

## 2023-01-02 ENCOUNTER — Ambulatory Visit: Payer: BC Managed Care – PPO | Admitting: Family Medicine

## 2023-01-02 DIAGNOSIS — J309 Allergic rhinitis, unspecified: Secondary | ICD-10-CM

## 2023-01-02 NOTE — Progress Notes (Deleted)
   [address], [address] [place] [address] Dept: [phone]  FOLLOW UP NOTE  Patient ID: Brand Males, female    DOB: 03/02/1986  Age: 37 y.o. MRN: [id number] Date of Office Visit: 01/02/2023  Assessment  Chief Complaint: No chief complaint on file.  HPI Nicole Collins is a 37 year old female who presents to the clinic for follow-up visit.  She was last seen in this clinic on 10/04/2022 by Gareth Morgan, FNP, for evaluation of asthma, allergic rhinitis, and penicillin allergy.  Her last environmental allergy testing was on 12/25/2018 and was positive to dust mite, cat, dog, horse, and equivocal to mold.   Drug Allergies:  Allergies  Allergen Reactions   Amoxicillin     REACTION: Hives and swelling   Penicillins Rash    Has patient had a PCN reaction causing immediate rash, facial/tongue/throat swelling, SOB or lightheadedness with hypotension: Yes Has patient had a PCN reaction causing severe rash involving mucus membranes or skin necrosis: Yes Has patient had a PCN reaction that required hospitalization no Has patient had a PCN reaction occurring within the last 10 years: No If all of the above answers are "NO", then may proceed with Cephalosporin use.    Physical Exam: There were no vitals taken for this visit.   Physical Exam  Diagnostics:    Assessment and Plan: No diagnosis found.  No orders of the defined types were placed in this encounter.   There are no Patient Instructions on file for this visit.  No follow-ups on file.    Thank you for the opportunity to care for this patient.  Please do not hesitate to contact me with questions.  Gareth Morgan, FNP [facility]

## 2023-04-08 DIAGNOSIS — B360 Pityriasis versicolor: Secondary | ICD-10-CM | POA: Diagnosis not present

## 2023-08-15 DIAGNOSIS — R09A2 Foreign body sensation, throat: Secondary | ICD-10-CM | POA: Diagnosis not present

## 2023-08-15 DIAGNOSIS — R490 Dysphonia: Secondary | ICD-10-CM | POA: Diagnosis not present

## 2024-01-19 DIAGNOSIS — D219 Benign neoplasm of connective and other soft tissue, unspecified: Secondary | ICD-10-CM

## 2024-01-19 HISTORY — DX: Benign neoplasm of connective and other soft tissue, unspecified: D21.9

## 2024-01-20 ENCOUNTER — Emergency Department (HOSPITAL_COMMUNITY)

## 2024-01-20 ENCOUNTER — Emergency Department (HOSPITAL_BASED_OUTPATIENT_CLINIC_OR_DEPARTMENT_OTHER)

## 2024-01-20 ENCOUNTER — Emergency Department (HOSPITAL_BASED_OUTPATIENT_CLINIC_OR_DEPARTMENT_OTHER)
Admission: EM | Admit: 2024-01-20 | Discharge: 2024-01-20 | Disposition: A | Discharge status: Home / Self Care | Attending: Emergency Medicine | Admitting: Emergency Medicine

## 2024-01-20 ENCOUNTER — Other Ambulatory Visit: Payer: Self-pay

## 2024-01-20 ENCOUNTER — Encounter (HOSPITAL_BASED_OUTPATIENT_CLINIC_OR_DEPARTMENT_OTHER): Payer: Self-pay | Admitting: Emergency Medicine

## 2024-01-20 DIAGNOSIS — R3915 Urgency of urination: Secondary | ICD-10-CM | POA: Insufficient documentation

## 2024-01-20 DIAGNOSIS — R350 Frequency of micturition: Secondary | ICD-10-CM | POA: Diagnosis not present

## 2024-01-20 DIAGNOSIS — R102 Pelvic and perineal pain: Secondary | ICD-10-CM | POA: Diagnosis not present

## 2024-01-20 DIAGNOSIS — R1032 Left lower quadrant pain: Secondary | ICD-10-CM | POA: Insufficient documentation

## 2024-01-20 DIAGNOSIS — R109 Unspecified abdominal pain: Secondary | ICD-10-CM | POA: Diagnosis present

## 2024-01-20 LAB — COMPREHENSIVE METABOLIC PANEL WITH GFR
ALT: 24 U/L (ref 0–44)
AST: 19 U/L (ref 15–41)
Albumin: 5.2 g/dL — ABNORMAL HIGH (ref 3.5–5.0)
Alkaline Phosphatase: 80 U/L (ref 38–126)
Anion gap: 11 (ref 5–15)
BUN: 14 mg/dL (ref 6–20)
CO2: 23 mmol/L (ref 22–32)
Calcium: 9.3 mg/dL (ref 8.9–10.3)
Chloride: 100 mmol/L (ref 98–111)
Creatinine, Ser: 0.65 mg/dL (ref 0.44–1.00)
GFR, Estimated: >60 mL/min (ref >60)
Glucose, Bld: 84 mg/dL (ref 70–99)
Potassium: 3.9 mmol/L (ref 3.5–5.1)
Sodium: 134 mmol/L — ABNORMAL LOW (ref 135–145)
Total Bilirubin: 0.6 mg/dL (ref 0.0–1.2)
Total Protein: 8.3 g/dL — ABNORMAL HIGH (ref 6.5–8.1)

## 2024-01-20 LAB — URINALYSIS, ROUTINE W REFLEX MICROSCOPIC
Bilirubin Urine: NEGATIVE
Glucose, UA: NEGATIVE mg/dL
Hgb urine dipstick: NEGATIVE
Leukocytes,Ua: NEGATIVE
Nitrite: NEGATIVE
Protein, ur: NEGATIVE mg/dL
Specific Gravity, Urine: 1.014 (ref 1.005–1.030)
pH: 5 (ref 5.0–8.0)

## 2024-01-20 LAB — CBC WITH DIFFERENTIAL/PLATELET
Abs Immature Granulocytes: 0.02 10*3/uL (ref 0.00–0.07)
Basophils Absolute: 0 10*3/uL (ref 0.0–0.1)
Basophils Relative: 0 %
Eosinophils Absolute: 0.1 10*3/uL (ref 0.0–0.5)
Eosinophils Relative: 1 %
HCT: 41.8 % (ref 36.0–46.0)
Hemoglobin: 13.7 g/dL (ref 12.0–15.0)
Immature Granulocytes: 0 %
Lymphocytes Relative: 31 %
Lymphs Abs: 2.7 10*3/uL (ref 0.7–4.0)
MCH: 29.3 pg (ref 26.0–34.0)
MCHC: 32.8 g/dL (ref 30.0–36.0)
MCV: 89.3 fL (ref 80.0–100.0)
Monocytes Absolute: 0.4 10*3/uL (ref 0.1–1.0)
Monocytes Relative: 5 %
Neutro Abs: 5.7 10*3/uL (ref 1.7–7.7)
Neutrophils Relative %: 63 %
Platelets: 239 10*3/uL (ref 150–400)
RBC: 4.68 MIL/uL (ref 3.87–5.11)
RDW: 13 % (ref 11.5–15.5)
WBC: 9 10*3/uL (ref 4.0–10.5)
nRBC: 0 % (ref 0.0–0.2)

## 2024-01-20 LAB — WET PREP, GENITAL
Clue Cells Wet Prep HPF POC: NONE SEEN
Sperm: NONE SEEN
Trich, Wet Prep: NONE SEEN
WBC, Wet Prep HPF POC: >=10 — AB (ref <10)
Yeast Wet Prep HPF POC: NONE SEEN

## 2024-01-20 LAB — PREGNANCY, URINE: Preg Test, Ur: NEGATIVE

## 2024-01-20 LAB — LIPASE, BLOOD: Lipase: 52 U/L — ABNORMAL HIGH (ref 11–51)

## 2024-01-20 MED ORDER — MORPHINE SULFATE (PF) 4 MG/ML IV SOLN: 4.0000 mg | Freq: Once | INTRAVENOUS | Status: DC

## 2024-01-20 MED ORDER — CELECOXIB 200 MG PO CAPS: 200.0000 mg | ORAL_CAPSULE | Freq: Two times a day (BID) | ORAL | 0 refills | Status: AC

## 2024-01-20 MED ORDER — KETOROLAC TROMETHAMINE 15 MG/ML IJ SOLN
15.0000 mg | Freq: Once | INTRAMUSCULAR | Status: AC
  Administered 2024-01-20: 15 mg via INTRAVENOUS
  Filled 2024-01-20: qty 1

## 2024-01-20 NOTE — ED Provider Notes (Signed)
 Care of patient received from prior provider at 10:38 PM, please see their note for complete H/P and care plan.  Received handoff per ED course.     Reassessment: Pelvic pain grossly improving on reassessment. Pelvic studies negative accepted small fibroids. Possible etiology of her symptoms.  Will treat with NSAIDs.  Given resolution of severe pain, unlikely be torsion or any other acute pathology at this time. She is ambulatory tolerating p.o. intake accepted distress patient feels comfortable outpatient care management at this time  Disposition:  I have considered need for hospitalization, however, considering all of the above, I believe this patient is stable for discharge at this time.  Patient/family educated about specific return precautions for given chief complaint and symptoms.  Patient/family educated about follow-up with PCP/GYN.     Patient/family expressed understanding of return precautions and need for follow-up. Patient spoken to regarding all imaging and laboratory results and appropriate follow up for these results. All education provided in verbal form with additional information in written form. Time was allowed for answering of patient questions. Patient discharged.    Emergency Department Medication Summary:   Medications  morphine (PF) 4 MG/ML injection 4 mg (0 mg Intravenous Hold 01/20/24 2056)  ketorolac (TORADOL) 15 MG/ML injection 15 mg (has no administration in time range)            Glyn Ade, MD 01/20/24 2238

## 2024-01-20 NOTE — ED Notes (Signed)
 Swabs performed by Provider and handled by NT.Marland KitchenMarland Kitchen

## 2024-01-20 NOTE — ED Notes (Signed)
 Pt left before V/S could be obtained.Nicole KitchenMarland Collins

## 2024-01-20 NOTE — Discharge Instructions (Addendum)
 You were seen today for pelvic pain. You underwent multiple studies that we discussed. No acute pathology were found except for some small uterine fibroids. I would recommend starting on an anti-inflammatory and following up with gynecology within a week for further recommendations depending on the severity of your symptoms

## 2024-01-20 NOTE — ED Notes (Signed)
 Pt was transferred by the PA before assessment could be obtained... medication - Morphine was held due to the Pt not having a ride over to [facility] ER.Marland KitchenMarland Kitchen

## 2024-01-20 NOTE — ED Notes (Signed)
 [place] made aware pt is here. Pt placed in transport to go to imaging

## 2024-01-20 NOTE — ED Notes (Signed)
Report given to the Charge at [facility] ED. ?

## 2024-01-20 NOTE — ED Provider Triage Note (Signed)
 Emergency Medicine Provider Triage Evaluation Note  Nicole Collins , a 38 y.o. female  was evaluated in triage.  Pt complains of flank and abd pain. Passed akidney stone 2 weeks ago. Now having urinary urgency LLQ abd pain and bloating and flank pain   Review of Systems  Positive: Flank and abd pain  Negative: Fever   Physical Exam  There were no vitals taken for this visit. Gen:   Awake, no distress   Resp:  Normal effort  MSK:   Moves extremities without difficulty  Other:    Medical Decision Making  Medically screening exam initiated at 4:57 PM.  Appropriate orders placed.  Nicole Collins was informed that the remainder of the evaluation will be completed by another provider, this initial triage assessment does not replace that evaluation, and the importance of remaining in the ED until their evaluation is complete.     Arthor Captain, PA-C 01/20/24 [phone]

## 2024-01-20 NOTE — ED Triage Notes (Signed)
 Passed kidney stone last week Difficulty urinating over weekend Left flank/ abdo pain

## 2024-01-20 NOTE — ED Notes (Signed)
 Pt was informed where to go, what to do by the PA... PA stated that the Pt understood and aware.Marland KitchenMarland Kitchen

## 2024-01-20 NOTE — ED Provider Notes (Signed)
 [facility] AT [facility] Provider Note   CSN: [id number] Arrival date & time: 01/20/24  1649     History  Chief Complaint  Patient presents with   Abdominal Pain   Flank Pain    Nicole Collins is a 38 y.o. female.  The history is provided by the patient and medical records. No language interpreter was used.  Abdominal Pain Flank Pain Associated symptoms include abdominal pain.     38 year old female history of kidney stone, surgical history of appendectomy presenting with complaint of flank pain.  Patient reports since yesterday she noted some urinary discomfort.  She has urgency to urinate and some increased frequency along with pain to left side of her flank and abdomen.  Symptoms been waxing waning but progressed throughout the day today which concerns her.  No associate fever or chills no nausea vomiting no change in diet no loss of appetite no chest pain or shortness of breath.  She has not noticed any blood in her urine no vaginal bleeding or vaginal discharge.  Last menstrual period was 12/30/2023.  Aside from increasing fluid to consume no treatment tried.  No change in sexual partner.  Home Medications Prior to Admission medications   Medication Sig Start Date End Date Taking? Authorizing Provider  acetaminophen (TYLENOL) 500 MG tablet Take 2 tablets (1,000 mg total) by mouth every 6 (six) hours. 05/31/19   Nicole Chapel, PA-C  albuterol (VENTOLIN HFA) 108 (90 Base) MCG/ACT inhaler Inhale 2 puffs into the lungs every 4 (four) hours as needed for wheezing or shortness of breath. 10/04/22   Ambs, Norvel Richards, FNP  fluticasone-salmeterol (WIXELA INHUB) 250-50 MCG/ACT AEPB Inhale 1 puff into the lungs in the morning and at bedtime. 12/06/22   Nicole Blend, FNP  ibuprofen (ADVIL,MOTRIN) 600 MG tablet Take 1 tablet (600 mg total) by mouth every 6 (six) hours as needed for moderate pain. 10/15/18   Nicole Collins, CNM  montelukast (SINGULAIR) 10 MG tablet  TAKE 1 TABLET BY MOUTH EVERYDAY AT BEDTIME 08/21/21   Nicole Settle, FNP  montelukast (SINGULAIR) 10 MG tablet Take 1 tablet (10 mg total) by mouth at bedtime. 10/04/22   Nicole Blend, FNP  predniSONE (DELTASONE) 10 MG tablet Begin prednisone 10 mg tablets. Take 2 tablets twice a day for 3 days, then take 2 tablets once a day for 1 day, then take 1 tablet on the 5th day, then stop 10/04/22   Ambs, Norvel Richards, FNP  Prenatal Vit-Fe Fumarate-FA (PRENATAL MULTIVITAMIN) TABS tablet Take 1 tablet by mouth at bedtime.     [provider]      Allergies    Amoxicillin and Penicillins    Review of Systems   Review of Systems  Gastrointestinal:  Positive for abdominal pain.  Genitourinary:  Positive for flank pain.  All other systems reviewed and are negative.   Physical Exam Updated Vital Signs BP (!) 148/94   Pulse 87   Temp 98.2 F (36.8 C) (Oral)   Resp 18   LMP 12/30/2023   SpO2 100%   Breastfeeding No  Physical Exam Vitals and nursing note reviewed.  Constitutional:      General: She is not in acute distress.    Appearance: She is well-developed.  HENT:     Head: Atraumatic.  Eyes:     Conjunctiva/sclera: Conjunctivae normal.  Cardiovascular:     Rate and Rhythm: Regular rhythm.     Pulses: Normal pulses.     Heart  sounds: Normal heart sounds.  Pulmonary:     Effort: Pulmonary effort is normal.  Abdominal:     Tenderness: There is abdominal tenderness (Tenderness to left lower quadrant). There is guarding. There is no right CVA tenderness, left CVA tenderness or rebound.  Musculoskeletal:     Cervical back: Neck supple.  Skin:    Findings: No rash.  Neurological:     Mental Status: She is alert.  Psychiatric:        Mood and Affect: Mood normal.     ED Results / Procedures / Treatments   Labs (all labs ordered are listed, but only abnormal results are displayed) Labs Reviewed  WET PREP, GENITAL - Abnormal; Notable for the following components:      Result  Value   WBC, Wet Prep HPF POC >=10 (*)    All other components within normal limits  COMPREHENSIVE METABOLIC PANEL WITH GFR - Abnormal; Notable for the following components:   Sodium 134 (*)    Total Protein 8.3 (*)    Albumin 5.2 (*)    All other components within normal limits  LIPASE, BLOOD - Abnormal; Notable for the following components:   Lipase 52 (*)    All other components within normal limits  URINALYSIS, ROUTINE W REFLEX MICROSCOPIC - Abnormal; Notable for the following components:   Ketones, ur TRACE (*)    All other components within normal limits  CBC WITH DIFFERENTIAL/PLATELET  PREGNANCY, URINE  GC/CHLAMYDIA PROBE AMP ([place]) NOT AT [facility]    EKG None  Radiology CT Renal Stone Study Result Date: 01/20/2024 CLINICAL DATA:  Flank and abdominal pain EXAM: CT ABDOMEN AND PELVIS WITHOUT CONTRAST TECHNIQUE: Multidetector CT imaging of the abdomen and pelvis was performed following the standard protocol without IV contrast. RADIATION DOSE REDUCTION: This exam was performed according to the departmental dose-optimization program which includes automated exposure control, adjustment of the mA and/or kV according to patient size and/or use of iterative reconstruction technique. COMPARISON:  CT abdomen and pelvis 05/30/2019 FINDINGS: Lower chest: No acute abnormality. Hepatobiliary: No focal liver abnormality is seen. No gallstones, gallbladder wall thickening, or biliary dilatation. Pancreas: Unremarkable. No pancreatic ductal dilatation or surrounding inflammatory changes. Spleen: Normal in size without focal abnormality. Adrenals/Urinary Tract: Adrenal glands are unremarkable. Kidneys are normal, without renal calculi, focal lesion, or hydronephrosis. Bladder is unremarkable. Stomach/Bowel: Stomach is within normal limits. Appendix is surgically absent. No evidence of bowel wall thickening, distention, or inflammatory changes. Vascular/Lymphatic: No significant vascular findings  are present. No enlarged abdominal or pelvic lymph nodes. Reproductive: Uterus and bilateral adnexa are unremarkable. Other: There is a small fat containing umbilical hernia. There is no ascites. Musculoskeletal: No acute or significant osseous findings. IMPRESSION: 1. No acute localizing process in the abdomen or pelvis. 2. Small fat containing umbilical hernia. Electronically Signed   By: Darliss Cheney M.D.   On: 01/20/2024 18:28    Procedures .Pelvic exam  Date/Time: 01/20/2024 7:36 PM  Performed by: Fayrene Helper, PA-C Authorized by: Fayrene Helper, PA-C  Comments: Chaperone present during exam.  No inguinal lymphadenopathy or inguinal hernia noted.  Normal external genitalia.  No significant discomfort with speculum insertion.  Normal vaginal vault no significant vaginal discharge close cervical os free of lesion or rash.  On bimanual exam patient has quite a bit of tenderness to the left adnexal region without cervical motion tenderness.       Medications Ordered in ED Medications  morphine (PF) 4 MG/ML injection 4 mg (has no  administration in time range)    ED Course/ Medical Decision Making/ A&P                                 Medical Decision Making Amount and/or Complexity of Data Reviewed Labs: ordered. Radiology: ordered.  Risk Prescription drug management.   BP (!) 148/94   Pulse 87   Temp 98.2 F (36.8 C) (Oral)   Resp 18   LMP 12/30/2023   SpO2 100%   Breastfeeding No   46:22 PM  38 year old female history of kidney stone, surgical history of appendectomy presenting with complaint of flank pain.  Patient reports since yesterday she noted some urinary discomfort.  She has urgency to urinate and some increased frequency along with pain to left side of her flank and abdomen.  Symptoms been waxing waning but progressed throughout the day today which concerns her.  No associate fever or chills no nausea vomiting no change in diet no loss of appetite no chest pain or  shortness of breath.  She has not noticed any blood in her urine no vaginal bleeding or vaginal discharge.  Last menstrual period was 12/30/2023.  Aside from increasing fluid to consume no treatment tried.  No change in sexual partner.  Exam notable for tenderness to her left lower abdomen and pelvic region with some guarding.  No CVA tenderness.  Patient overall well-appearing.  Vital signs normal.  -Labs ordered, independently viewed and interpreted by me.  Labs remarkable for reassuring labs -The patient was maintained on a cardiac monitor.  I personally viewed and interpreted the cardiac monitored which showed an underlying rhythm of: Normal sinus rhythm -Imaging independently viewed and interpreted by me and I agree with radiologist's interpretation.  Result remarkable for abdominal pelvis CT scan without any concerning finding -This patient presents to the ED for concern of pelvic pain, this involves an extensive number of treatment options, and is a complaint that carries with it a high risk of complications and morbidity.  The differential diagnosis includes kidney stone, UTI, ovarian torsion, TOA, ectopic pregnancy, PID, diverticular disease, pyelonephritis -Co morbidities that complicate the patient evaluation includes kidney stone -Treatment includes morphine -Reevaluation of the patient after these medicines showed that the patient improved -PCP office notes or outside notes reviewed -Discussion with ER attending Dr. Doran Durand who agrees to accept patient to [facility] for shoulder rule out as ultrasound capabilities is not available at this time.  Patient will be transferring via POV. -Escalation to admission/observation considered: pt will go to [facility] for further evaluation         Final Clinical Impression(s) / ED Diagnoses Final diagnoses:  Pelvic pain    Rx / DC Orders ED Discharge Orders     None         Fayrene Helper, PA-C 01/20/24 2011    Melene Plan,  DO 01/20/24 2126

## 2024-01-20 NOTE — ED Notes (Signed)
Blood obtained in triage.

## 2024-01-21 LAB — GC/CHLAMYDIA PROBE AMP (~~LOC~~) NOT AT ARMC
Chlamydia: NEGATIVE
Comment: NEGATIVE
Comment: NORMAL
Neisseria Gonorrhea: NEGATIVE

## 2024-06-29 ENCOUNTER — Ambulatory Visit (INDEPENDENT_AMBULATORY_CARE_PROVIDER_SITE_OTHER): Admitting: Obstetrics & Gynecology

## 2024-06-29 ENCOUNTER — Other Ambulatory Visit (HOSPITAL_COMMUNITY)
Admission: RE | Admit: 2024-06-29 | Discharge: 2024-06-29 | Disposition: A | Discharge status: Home / Self Care | Source: Ambulatory Visit | Attending: Obstetrics & Gynecology | Admitting: Obstetrics & Gynecology

## 2024-06-29 ENCOUNTER — Encounter: Payer: Self-pay | Admitting: Obstetrics & Gynecology

## 2024-06-29 VITALS — BP 120/83 | HR 82 | Ht 60.0 in | Wt 170.2 lb

## 2024-06-29 DIAGNOSIS — Z01419 Encounter for gynecological examination (general) (routine) without abnormal findings: Secondary | ICD-10-CM

## 2024-06-29 DIAGNOSIS — Z124 Encounter for screening for malignant neoplasm of cervix: Secondary | ICD-10-CM | POA: Insufficient documentation

## 2024-06-29 DIAGNOSIS — N920 Excessive and frequent menstruation with regular cycle: Secondary | ICD-10-CM | POA: Diagnosis not present

## 2024-06-29 MED ORDER — TRANEXAMIC ACID 650 MG PO TABS: 1300.0000 mg | ORAL_TABLET | Freq: Three times a day (TID) | ORAL | 11 refills | Status: AC

## 2024-06-29 NOTE — Progress Notes (Signed)
 WELL-WOMAN EXAMINATION Patient name: Nicole Collins MRN [id number]  Date of birth: 04-09-1986 Chief Complaint:   Annual Exam  History of Present Illness:   Nicole Collins is a 38 y.o. [phone] female being seen today for a routine well-woman exam.   Since March she has noted a change in her menses.  It will now last for 2 days, but be superheavy where she may have to change a pad every hour it will then lighten up to just spotting.  Denies intermenstrual bleeding.  Denies significant dysmenorrhea.  Same partner, using condoms.  Denies vaginal discharge, itching, irritation.  Denies pelvic or abdominal pain.  She reports no other acute GYN concerns   Patient's last menstrual period was 06/23/2024 (exact date).  The current method of family planning is condoms.      Last pap 2021.  Last mammogram: NA. Last colonoscopy: NA     06/29/2024    3:45 PM  Depression screen PHQ 2/9  Decreased Interest 1  Down, Depressed, Hopeless 1  PHQ - 2 Score 2  Altered sleeping 1  Tired, decreased energy 1  Change in appetite 1  Feeling bad or failure about yourself  1  Trouble concentrating 1  Moving slowly or fidgety/restless 0  Suicidal thoughts 0  PHQ-9 Score 7      Review of Systems:   Pertinent items are noted in HPI Denies any headaches, blurred vision, fatigue, shortness of breath, chest pain, abdominal pain, bowel movements, urination, or intercourse unless otherwise stated above.  Pertinent History Reviewed:  Reviewed past medical,surgical, social and family history.  Reviewed problem list, medications and allergies. Physical Assessment:   Vitals:   06/29/24 1536  BP: 120/83  Pulse: 82  Weight: 170 lb 3.2 oz (77.2 kg)  Height: 5' (1.524 m)  Body mass index is 33.24 kg/m.        Physical Examination:   General appearance - well appearing, and in no distress  Mental status - alert, oriented to person, place, and time  Psych:  She has a normal mood and affect  Skin -  warm and dry, normal color, no suspicious lesions noted  Chest - effort normal, all lung fields clear to auscultation bilaterally  Heart - normal rate and regular rhythm  Neck:  midline trachea, no thyromegaly or nodules  Breasts - breasts appear normal, no suspicious masses, no skin or nipple changes or  axillary nodes  Abdomen - soft, nontender, nondistended, no masses or organomegaly  Pelvic - VULVA: normal appearing vulva with no masses, tenderness or lesions  VAGINA: normal appearing vagina with normal color and discharge, no lesions  CERVIX: normal appearing cervix without discharge or lesions, no CMT  Thin prep pap is done with HR HPV cotesting  UTERUS: uterus is felt to be normal size, shape, consistency and nontender   ADNEXA: No adnexal masses or tenderness noted.  Extremities:  No swelling or varicosities noted  Chaperone: Aleck Blase     Last US  12/2023: 9.2 x 4.1 x 5.5 cm = volume: 108 mL. Small anterior fibroids measuring 1.4 cm and 1.1 cm maximally in the lower uterine segment.  Normal ovaries bilaterally  Assessment & Plan:  1) Well-Woman Exam -Pap collected, reviewed ASCCP guidelines  2) HMB - Due to prior side effects with hormonal contraceptive options, ideally would avoid hormonal intervention - Plan for Lysteda , f/u if worsening of symptoms -Prior US  12/2023 reviewed, no acute concerns  No orders of the defined types were placed in this encounter.  Meds:  Meds ordered this encounter  Medications   tranexamic acid  (LYSTEDA ) 650 MG TABS tablet    Sig: Take 2 tablets (1,300 mg total) by mouth 3 (three) times daily for 5 days. During your menses only    Dispense:  30 tablet    Refill:  11    Follow-up: Return in about 1 year (around 06/29/2025) for Annual.   Aloys Hupfer, DO Attending Obstetrician & Gynecologist, [facility] for [facility], [facility]

## 2024-07-01 LAB — CYTOLOGY - PAP
Comment: NEGATIVE
Diagnosis: NEGATIVE
High risk HPV: NEGATIVE

## 2024-07-02 ENCOUNTER — Telehealth: Payer: Self-pay

## 2024-07-02 ENCOUNTER — Ambulatory Visit: Payer: Self-pay | Admitting: Obstetrics & Gynecology

## 2024-07-02 NOTE — Telephone Encounter (Signed)
 Called patient with Pap results. All questions answered.

## 2024-07-02 NOTE — Telephone Encounter (Signed)
 Tried calling patient with pap smear results. Voicemail full, unable to leave message.
